# Patient Record
Sex: Female | Born: 1995 | Race: Black or African American | Hispanic: No | Marital: Single | Smoking: Never smoker
Health system: Southern US, Community
[De-identification: ages and names within clinical notes are randomized; demographics above are authoritative.]

## PROBLEM LIST (undated history)

## (undated) DIAGNOSIS — A64 Unspecified sexually transmitted disease: Secondary | ICD-10-CM

## (undated) DIAGNOSIS — N946 Dysmenorrhea, unspecified: Secondary | ICD-10-CM

## (undated) DIAGNOSIS — L309 Dermatitis, unspecified: Secondary | ICD-10-CM

## (undated) HISTORY — DX: Unspecified sexually transmitted disease: A64

## (undated) HISTORY — DX: Dysmenorrhea, unspecified: N94.6

---

## 2014-03-24 DIAGNOSIS — A64 Unspecified sexually transmitted disease: Secondary | ICD-10-CM

## 2014-03-24 HISTORY — DX: Unspecified sexually transmitted disease: A64

## 2014-04-09 ENCOUNTER — Encounter (HOSPITAL_COMMUNITY): Payer: Self-pay

## 2014-04-09 ENCOUNTER — Emergency Department (HOSPITAL_COMMUNITY)
Admission: EM | Admit: 2014-04-09 | Discharge: 2014-04-09 | Disposition: A | Discharge status: Home / Self Care | Payer: Medicaid Other | Attending: Emergency Medicine | Admitting: Emergency Medicine

## 2014-04-09 DIAGNOSIS — L500 Allergic urticaria: Secondary | ICD-10-CM | POA: Insufficient documentation

## 2014-04-09 DIAGNOSIS — R21 Rash and other nonspecific skin eruption: Secondary | ICD-10-CM | POA: Diagnosis present

## 2014-04-09 DIAGNOSIS — T7849XA Other allergy, initial encounter: Secondary | ICD-10-CM | POA: Insufficient documentation

## 2014-04-09 DIAGNOSIS — X58XXXA Exposure to other specified factors, initial encounter: Secondary | ICD-10-CM | POA: Insufficient documentation

## 2014-04-09 DIAGNOSIS — Y9389 Activity, other specified: Secondary | ICD-10-CM | POA: Insufficient documentation

## 2014-04-09 DIAGNOSIS — Y9289 Other specified places as the place of occurrence of the external cause: Secondary | ICD-10-CM | POA: Insufficient documentation

## 2014-04-09 DIAGNOSIS — Y998 Other external cause status: Secondary | ICD-10-CM | POA: Insufficient documentation

## 2014-04-09 DIAGNOSIS — T7840XA Allergy, unspecified, initial encounter: Secondary | ICD-10-CM

## 2014-04-09 MED ORDER — PREDNISONE 20 MG PO TABS
60.0000 mg | ORAL_TABLET | Freq: Once | ORAL | Status: AC
  Administered 2014-04-09: 60 mg via ORAL
  Filled 2014-04-09: qty 3

## 2014-04-09 MED ORDER — DIPHENHYDRAMINE HCL 25 MG PO CAPS
25.0000 mg | ORAL_CAPSULE | Freq: Once | ORAL | Status: AC
  Administered 2014-04-09: 25 mg via ORAL
  Filled 2014-04-09: qty 1

## 2014-04-09 MED ORDER — FAMOTIDINE 20 MG PO TABS
20.0000 mg | ORAL_TABLET | Freq: Once | ORAL | Status: AC
  Administered 2014-04-09: 20 mg via ORAL
  Filled 2014-04-09: qty 1

## 2014-04-09 MED ORDER — FAMOTIDINE 20 MG PO TABS: 20.0000 mg | ORAL_TABLET | Freq: Two times a day (BID) | ORAL | Status: DC

## 2014-04-09 MED ORDER — DIPHENHYDRAMINE HCL 25 MG PO CAPS: 25.0000 mg | ORAL_CAPSULE | ORAL | Status: DC | PRN

## 2014-04-09 NOTE — ED Provider Notes (Signed)
CSN: [id number]     Arrival date & time 04/09/14  1536 History  This chart was scribed for Fayrene Helper, PA-C, working with Gwyneth Sprout, MD by Chestine Spore, ED Scribe. The patient was seen in room TR08C/TR08C at 3:51 PM.    Chief Complaint  Patient presents with  . Rash    The history is provided by the patient. No language interpreter was used.    HPI Comments: Bethany Dodson is a 19 y.o. female who presents to the Emergency Department complaining of rash onset 1 week. She notes that she just changed her body wash 4 days ago. She reports that 3 days ago she noticed hives and itching on her forearms and back of both legs. She states that she is having associated symptoms of redness and itching. She states that she has not tried any medication for the relief of her symptoms. She denies fever, n/v/d, SOB, chest tightness, abdominal pain and any other symptoms. She denies any new medications or foods. She denies being allergic to any medications.   History reviewed. No pertinent past medical history. History reviewed. No pertinent past surgical history. No family history on file. History  Substance Use Topics  . Smoking status: Never Smoker   . Smokeless tobacco: Not on file  . Alcohol Use: No   OB History    No data available     Review of Systems  Constitutional: Negative for fever.  Respiratory: Negative for chest tightness and shortness of breath.   Gastrointestinal: Negative for nausea, vomiting, abdominal pain and diarrhea.  Skin: Positive for color change and rash.      Allergies  Review of patient's allergies indicates no known allergies.  Home Medications   Prior to Admission medications   Medication Sig Start Date End Date Taking? Authorizing Provider  diphenhydrAMINE (BENADRYL) 25 mg capsule Take 1 capsule (25 mg total) by mouth every 4 (four) hours as needed for allergies. 04/09/14   Fayrene Helper, PA-C  famotidine (PEPCID) 20 MG tablet Take 1 tablet (20 mg total)  by mouth 2 (two) times daily. 04/09/14   Fayrene Helper, PA-C   BP 113/69 mmHg  Pulse 74  Temp(Src) 98.3 F (36.8 C) (Oral)  Resp 18  Ht  (1.651 m)  Wt 120 lb (54.432 kg)  BMI 19.97 kg/m2  SpO2 100%  Physical Exam  Constitutional: She is oriented to person, place, and time. She appears well-developed and well-nourished. No distress.  HENT:  Head: Normocephalic and atraumatic.  Eyes: EOM are normal.  Neck: Neck supple. No tracheal deviation present.  Cardiovascular: Normal rate.   Pulmonary/Chest: Effort normal and breath sounds normal. No respiratory distress. She has no wheezes. She has no rales.  Musculoskeletal: Normal range of motion.  Neurological: She is alert and oriented to person, place, and time.  Skin: Skin is warm and dry. Rash noted. Rash is urticarial. No erythema.  Small area of Urticaria noted to bilateral forearms.  No erythema noted to the area.  Psychiatric: She has a normal mood and affect. Her behavior is normal.  Nursing note and vitals reviewed.   ED Course  Procedures (including critical care time) DIAGNOSTIC STUDIES: Oxygen Saturation is 100% on room air, normal by my interpretation.    COORDINATION OF CARE: 3:54 PM-Discussed treatment plan which includes benadryl Rx, deltasone, Pepcid Rx with pt at bedside and pt agreed to plan.   3:59 PM Patient with allergic reaction, likely due to either body wash or soap. She has no sxs  of anaphylatic reaction.  Stable for discharge.   Labs Review Labs Reviewed - No data to display  Imaging Review No results found.   EKG Interpretation None      MDM   Final diagnoses:  Allergic reaction, initial encounter    BP 113/69 mmHg  Pulse 74  Temp(Src) 98.3 F (36.8 C) (Oral)  Resp 18  Ht 5\' 5"  (1.651 m)  Wt 120 lb (54.432 kg)  BMI 19.97 kg/m2  SpO2 100%  LMP 04/09/2014   I personally performed the services described in this documentation, which was scribed in my presence. The recorded  information has been reviewed and is accurate.    Fayrene HelperBowie Kalev Temme, PA-C 04/09/14 1601  Gwyneth SproutWhitney Plunkett, MD 04/09/14 2240

## 2014-04-09 NOTE — ED Notes (Signed)
Declined W/C at D/C and was escorted to lobby by RN. 

## 2014-04-09 NOTE — Discharge Instructions (Signed)
Allergies °Allergies may happen from anything your body is sensitive to. This may be food, medicines, pollens, chemicals, and nearly anything around you in everyday life that produces allergens. An allergen is anything that causes an allergy producing substance. Heredity is often a factor in causing these problems. This means you may have some of the same allergies as your parents. °Food allergies happen in all age groups. Food allergies are some of the most severe and life threatening. Some common food allergies are cow's milk, seafood, eggs, nuts, wheat, and soybeans. °SYMPTOMS  °· Swelling around the mouth. °· An itchy red rash or hives. °· Vomiting or diarrhea. °· Difficulty breathing. °SEVERE ALLERGIC REACTIONS ARE LIFE-THREATENING. °This reaction is called anaphylaxis. It can cause the mouth and throat to swell and cause difficulty with breathing and swallowing. In severe reactions only a trace amount of food (for example, peanut oil in a salad) may cause death within seconds. °Seasonal allergies occur in all age groups. These are seasonal because they usually occur during the same season every year. They may be a reaction to molds, grass pollens, or tree pollens. Other causes of problems are house dust mite allergens, pet dander, and mold spores. The symptoms often consist of nasal congestion, a runny itchy nose associated with sneezing, and tearing itchy eyes. There is often an associated itching of the mouth and ears. The problems happen when you come in contact with pollens and other allergens. Allergens are the particles in the air that the body reacts to with an allergic reaction. This causes you to release allergic antibodies. Through a chain of events, these eventually cause you to release histamine into the blood stream. Although it is meant to be protective to the body, it is this release that causes your discomfort. This is why you were given anti-histamines to feel better.  If you are unable to  pinpoint the offending allergen, it may be determined by skin or blood testing. Allergies cannot be cured but can be controlled with medicine. °Hay fever is a collection of all or some of the seasonal allergy problems. It may often be treated with simple over-the-counter medicine such as diphenhydramine. Take medicine as directed. Do not drink alcohol or drive while taking this medicine. Check with your caregiver or package insert for child dosages. °If these medicines are not effective, there are many new medicines your caregiver can prescribe. Stronger medicine such as nasal spray, eye drops, and corticosteroids may be used if the first things you try do not work well. Other treatments such as immunotherapy or desensitizing injections can be used if all else fails. Follow up with your caregiver if problems continue. These seasonal allergies are usually not life threatening. They are generally more of a nuisance that can often be handled using medicine. °HOME CARE INSTRUCTIONS  °· If unsure what causes a reaction, keep a diary of foods eaten and symptoms that follow. Avoid foods that cause reactions. °· If hives or rash are present: °¨ Take medicine as directed. °¨ You may use an over-the-counter antihistamine (diphenhydramine) for hives and itching as needed. °¨ Apply cold compresses (cloths) to the skin or take baths in cool water. Avoid hot baths or showers. Heat will make a rash and itching worse. °· If you are severely allergic: °¨ Following a treatment for a severe reaction, hospitalization is often required for closer follow-up. °¨ Wear a medic-alert bracelet or necklace stating the allergy. °¨ You and your family must learn how to give adrenaline or use   an anaphylaxis kit.  If you have had a severe reaction, always carry your anaphylaxis kit or EpiPen with you. Use this medicine as directed by your caregiver if a severe reaction is occurring. Failure to do so could have a fatal outcome. SEEK MEDICAL  CARE IF:  You suspect a food allergy. Symptoms generally happen within 30 minutes of eating a food.  Your symptoms have not gone away within 2 days or are getting worse.  You develop new symptoms.  You want to retest yourself or your child with a food or drink you think causes an allergic reaction. Never do this if an anaphylactic reaction to that food or drink has happened before. Only do this under the care of a caregiver. SEEK IMMEDIATE MEDICAL CARE IF:   You have difficulty breathing, are wheezing, or have a tight feeling in your chest or throat.  You have a swollen mouth, or you have hives, swelling, or itching all over your body.  You have had a severe reaction that has responded to your anaphylaxis kit or an EpiPen. These reactions may return when the medicine has worn off. These reactions should be considered life threatening. MAKE SURE YOU:   Understand these instructions.  Will watch your condition.  Will get help right away if you are not doing well or get worse. Document Released: 06/03/2002 Document Revised: 07/05/2012 Document Reviewed: 11/08/2007 [facility] Patient Information 2015 [place], [place]. This information is not intended to replace advice given to you by your health care provider. Make sure you discuss any questions you have with your health care provider.   Emergency Department Resource Guide 1) Find a Doctor and Pay Out of Pocket Although you won't have to find out who is covered by your insurance plan, it is a good idea to ask around and get recommendations. You will then need to call the office and see if the doctor you have chosen will accept you as a new patient and what types of options they offer for patients who are self-pay. Some doctors offer discounts or will set up payment plans for their patients who do not have insurance, but you will need to ask so you aren't surprised when you get to your appointment.  2) Contact Your Local Health  Department Not all health departments have doctors that can see patients for sick visits, but many do, so it is worth a call to see if yours does. If you don't know where your local health department is, you can check in your phone book. The CDC also has a tool to help you locate your state's health department, and many state websites also have listings of all of their local health departments.  3) Find a [facility] If your illness is not likely to be very severe or complicated, you may want to try a walk in clinic. These are popping up all over the country in pharmacies, drugstores, and shopping centers. They're usually staffed by nurse practitioners or physician assistants that have been trained to treat common illnesses and complaints. They're usually fairly quick and inexpensive. However, if you have serious medical issues or chronic medical problems, these are probably not your best option.  No Primary Care Doctor: - Call [facility] Connect at  ([phone] - they can help you locate a primary care doctor that  accepts your insurance, provides certain services, etc. - Physician Referral Service- [phone]  Chronic Pain Problems: Organization         Address  Phone  Notes  °[facility]  (336) 297-2271 Patients need to be referred by their primary care doctor.  ° °Medication Assistance: °Organization         Address  Phone   Notes  °[place] [facility] [address] °[place], [place] [address] (336) 641-8030 --Must be a resident of [place] °-- Must have NO insurance coverage whatsoever (no Medicaid/ Medicare, etc.) °-- The pt. MUST have a primary care doctor that directs their care regularly and follows them in the community °  °[facility]  (866) 331-1348   °[employer]  (888) 892-1162   ° °Agencies that provide inexpensive medical care: °Organization         Address  Phone   Notes  °[facility]  (336) 832-8035   °[facility]    (336) 832-7272   °[facility] [address] °[place], [place] [address] (336) 832-4777   °[facility] [address], °[place] (336) 271-4999   °[facility]    (336) 373-0678   °[facility]    (336) 272-1050   °[facility] ° [address], [place] Phone:  (336) 832-4444, Fax:  (336) 832-4440 Hours of Operation:  9 am - 6 pm, M-F.  Also accepts Medicaid/Medicare and self-pay.  °[facility] ° [address], [place] Phone: (336) [phone], Fax: (336) [phone]. Hours of Operation:  8:30 am - 5:30 pm, M-F.  Also accepts Medicaid and self-pay.  °[facility] [place] [address], [place] Phone: (336) 878-6027   °[facility] [address], [place], [place] (336)723-1848, Ext. 123 Mondays & Thursdays: 7-9 AM.  First 15 patients are seen on a first come, first serve basis. °  ° °Medicaid-accepting [place] Providers: ° °Organization         Address  Phone   Notes  °[facility] [address], [place] (336) 641-2100 Also accepts self-pay patients.  °[facility] [address], [place] ° (336) 856-9996   °[facility] [address], [place] (336) 288-8857   °[facility] [address] [place] [address], [place] (336) 299-7000   °Veita Bland [address], [place]  ° (336) 373-1557 Only accepts [place] Access Medicaid patients after they have their name applied to their card.  ° °Self-Pay (no insurance) in [place]: ° °Organization         Address  Phone   Notes  °Sickle Cell Patients, [facility] [address], [place] (336) 832-1970   °[facility] [address], [place] (336) 832-4400   °[facility] [place] ° [address],  [place] (336) 992-4800   °[facility]/Dr. Osei-Bonsu ° [address] [place] [address], [place] or [address], [place] (336) [phone] Phone number for both [place] and [place] locations is the same.  °[facility] [address], [place] (336) 299-0000   °[facility] [address] [place] [address], [place] or [address] (336) 852-7530 °(336) 878-2260   °[facility] [address], [place] (336) 350-1642, phone; (336) 294-5005, fax Sees patients 1st and 3rd Saturday of every month.  Must not qualify for public or private insurance (  i.e. Medicaid, Medicare, [place] Health Choice, Veterans' Benefits) • Household income should be no more than 200% of the poverty level •The clinic cannot treat you if you are pregnant or think you are pregnant • Sexually transmitted diseases are not treated at the clinic.  ° ° °Dental Care: °Organization         Address  Phone  Notes  °[facility] [address], [place] (336) 641-6152 Accepts children up to age 21 who are enrolled in Medicaid or [place] Health Choice; pregnant women with a Medicaid card; and children who have applied for Medicaid or [place] Health Choice, but were declined, whose parents can pay a reduced fee at time of service.  °[facility] [place]  [address], [place] (336) 641-7733 Accepts children up to age 21 who are enrolled in Medicaid or [place] Health Choice; pregnant women with a Medicaid card; and children who have applied for Medicaid or [place] Health Choice, but were declined, whose parents can pay a reduced fee at time of service.  °[facility] ° [address], [place] (336) 641-4533 Patients are seen by appointment only. Walk-ins are not accepted. [facility] will see patients 18 years of age and older. °Monday - Tuesday (8am-5pm) °Most Wednesdays  (8:30-5pm) °$30 per visit, cash only  °[facility] ° [address], [place] (336) 641-4533 Patients are seen by appointment only. Walk-ins are not accepted. [facility] will see patients 18 years of age and older. °One Wednesday Evening (Monthly: Volunteer Based).  $30 per visit, cash only  °[facility]  (919) 537-3737 for adults; Children under age 4, call Graduate Pediatric Dentistry at ([phone]) [phone]. Children aged 4-14, please call ([phone]) [phone] to request a pediatric application. ° Dental services are provided in all areas of dental care including fillings, crowns and bridges, complete and partial dentures, implants, gum treatment, root canals, and extractions. Preventive care is also provided. Treatment is provided to both adults and children. °Patients are selected via a lottery and there is often a waiting list. °  °[facility] [address], °[place] ° (336) 763-8833 [email or web address] °  °[facility] [address], [place], [place] (336)723-1848, Ext. 123 Second and Fourth Thursday of each month, opens at 6:30 AM; Clinic ends at 9 AM.  Patients are seen on a first-come first-served basis, and a limited number are seen during each clinic.  ° °[facility] ° [address], [place], [place] (336) 723-7904   Eligibility Requirements °You must have lived in [place], [place], or [place] counties for at least the last three months. °  You cannot be eligible for state or federal sponsored healthcare insurance, including Veterans Administration, Medicaid, or Medicare. °  You generally cannot be eligible for healthcare insurance through your employer.  °  How to apply: °Eligibility screenings are held every Tuesday and Wednesday afternoon from 1:00 pm until 4:00 pm. You do not need an appointment for the interview!  °[facility] [address], [place], [place] 336-631-2330   °[place]  [facility]  336-342-8273   °[place]  336-703-3100   °[facility]  336-570-6415   ° °Behavioral Health Resources in the Community: °Intensive Outpatient Programs °Organization         Address  Phone  Notes  °[place]   [facility] [address], [place], [place] 336-878-6098   °[facility] [address], [place], [place] 336-832-9800   °[facility] [address], [place], [place] ° 336-882-2125   °[facility] [address],  °[place], [place] 1-800-853-5163 or 336-641-4981   °Substance Abuse Resources °Organization         Address  Phone  Notes  °[facility]  336-882-2125   °[facility]  336-784-9470   °[facility] [place] [facility]  336-285-9073   °[facility]  336-845-3988   °Residential & Outpatient Substance Abuse Program  1-800-659-3381   °Psychological Services °Organization         Address  Phone  Notes  °[facility]  336- 832-9600   °[facility]  336- 378-7881   °[facility] [address], [place] 1-800-853-5163 or 336-641-4981   ° °Mobile Crisis Teams °Organization         Address  Phone  Notes  °[facility], Mobile Crisis Care Unit  1-877-626-1772   °[facility] °[facility] ° [address] [place], [place] 336-834-9664   °Sharon DeEsch [address] °[place] 336-554-5454   ° °Self-Help/Support Groups °Organization         Address  Phone             Notes  °[facility] - variety of support groups  336- 373-1402 Call for more information  °Narcotics Anonymous (NA), [facility] [address], °[place]  2 meetings at this location  ° °Residential Treatment Programs °Organization         Address  Phone  Notes  °[facility] [address],    °[place]  1-866-801-8205   °[facility] ° [address], [place], [place]  704-293-8524   °[facility] [address], [place] 336-845-3988 Admissions: 8am-3pm M-F  °[facility] [address]    °[place], [place] 336-841-1104   °[facility] [address], [place], [place] 336-379-7146   °[facility] [address]  °[place], [place] 336-285-9073   °[facility] - Intensive Outpatient [address], [place], [place] 336-852-3033   °[facility] [address]  °[place], [place] 1-877-615-2722 or 336-784-9470   °[facility] [address] [place], [place] 336-227-7417 Accepts Medicaid  °[facility] [address]  ° [place] 1-800-659-3381 Substance Abuse/Addiction Treatment  ° °[place] Behavioral Health Resources °Organization         Address  Phone  Notes  °[facility]  (888) 581-9988   °Julie Brannon, PhD [address] [place], [place]   (336) 349-5553 or (336) 951-0000   °[facility]   [address] °[place], [place] (336) 349-4454   °[facility] [address], [place], [place] (336) 342-8316 Insurance/Medicaid/sponsorship through [facility]  °[facility] [address]                                    [place], [place] (336) 342-8316 Therapy/tele-psych/case  °[facility] [address]  ° , [place] (336) 349-2233    °Dr. Arfeen  (336) 349-4544   °[facility] [place]  [employer] [place] [facility] 1)   [address], [place] °2) [address], [place] °3)  [address], [place] (336) 349-3220 °(336) 342-7768 ° °(336) 342-8140   °[place] [facility] Abuse [facility] (336) 342-1394 or (336) 342-3537 (After Hours)    ° ° ° °

## 2014-04-09 NOTE — ED Notes (Signed)
Pt. Reports generalized rash x1 week with redness and itching. States it is worse at night. Denies fevers, N/v.

## 2014-05-24 ENCOUNTER — Encounter (HOSPITAL_COMMUNITY): Payer: Self-pay

## 2014-05-24 ENCOUNTER — Emergency Department (HOSPITAL_COMMUNITY)
Admission: EM | Admit: 2014-05-24 | Discharge: 2014-05-24 | Disposition: A | Discharge status: Home / Self Care | Payer: Medicaid Other | Attending: Emergency Medicine | Admitting: Emergency Medicine

## 2014-05-24 DIAGNOSIS — Z79899 Other long term (current) drug therapy: Secondary | ICD-10-CM | POA: Insufficient documentation

## 2014-05-24 DIAGNOSIS — J029 Acute pharyngitis, unspecified: Secondary | ICD-10-CM | POA: Insufficient documentation

## 2014-05-24 LAB — RAPID STREP SCREEN (MED CTR MEBANE ONLY): Streptococcus, Group A Screen (Direct): NEGATIVE

## 2014-05-24 MED ORDER — SALINE SPRAY 0.65 % NA SOLN: 1.0000 | NASAL | Status: DC | PRN

## 2014-05-24 MED ORDER — GUAIFENESIN 100 MG/5ML PO LIQD: 100.0000 mg | ORAL | Status: DC | PRN

## 2014-05-24 MED ORDER — IBUPROFEN 600 MG PO TABS: 600.0000 mg | ORAL_TABLET | Freq: Four times a day (QID) | ORAL | Status: DC | PRN

## 2014-05-24 NOTE — ED Notes (Signed)
Pt. Given Orange juice

## 2014-05-24 NOTE — ED Notes (Signed)
Pt. Reports dry cough and sore throat.

## 2014-05-24 NOTE — ED Provider Notes (Signed)
CSN: [id number]     Arrival date & time 05/24/14  1716 History  This chart was scribed for Bethany Finner, PA-C, working with Harrold Donath R. Rubin Payor, MD by Chestine Spore, ED Scribe. The patient was seen in room TR07C/TR07C at 6:57 PM.    Chief Complaint  Patient presents with  . Sore Throat      The history is provided by the patient. No language interpreter was used.    HPI Comments: Bethany Dodson is a 19 y.o. female who presents to the Emergency Department complaining of sore throat onset yesterday. Pain is sore, 5/10, worse with eating. Pt reports that her sister is sick as well. She states that she is having associated symptoms of dry cough, fever, chills, congestion, and HA. She states that she has tried mucinex with no relief for her symptoms. She denies ear pain, abdominal pain, nausea, vomiting, trouble swallowing, SOB, and any other symptoms. Denies being allergic to any medications.   History reviewed. No pertinent past medical history. History reviewed. No pertinent past surgical history. No family history on file. History  Substance Use Topics  . Smoking status: Never Smoker   . Smokeless tobacco: Not on file  . Alcohol Use: No   OB History    No data available     Review of Systems  Constitutional: Positive for fever and chills.  HENT: Positive for congestion and sore throat. Negative for ear pain and trouble swallowing.   Respiratory: Positive for cough. Negative for shortness of breath.   Gastrointestinal: Negative for nausea, vomiting and abdominal pain.  Neurological: Positive for headaches.      Allergies  Review of patient's allergies indicates no known allergies.  Home Medications   Prior to Admission medications   Medication Sig Start Date End Date Taking? Authorizing Provider  diphenhydrAMINE (BENADRYL) 25 mg capsule Take 1 capsule (25 mg total) by mouth every 4 (four) hours as needed for allergies. 04/09/14   Fayrene Helper, PA-C  famotidine (PEPCID) 20 MG  tablet Take 1 tablet (20 mg total) by mouth 2 (two) times daily. 04/09/14   Fayrene Helper, PA-C  guaiFENesin (ROBITUSSIN) 100 MG/5ML liquid Take 5-10 mLs (100-200 mg total) by mouth every 4 (four) hours as needed for cough. 05/24/14   Bethany Finner, PA-C  ibuprofen (ADVIL,MOTRIN) 600 MG tablet Take 1 tablet (600 mg total) by mouth every 6 (six) hours as needed. 05/24/14   Bethany Finner, PA-C  sodium chloride (OCEAN) 0.65 % SOLN nasal spray Place 1 spray into both nostrils as needed for congestion. 05/24/14   Bethany Finner, PA-C   BP 140/83 mmHg  Pulse 104  Temp(Src) 98.7 F (37.1 C) (Oral)  Resp 16  Ht  (1.651 m)  Wt 122 lb (55.339 kg)  BMI 20.30 kg/m2  SpO2 100%  LMP 05/24/2014  Physical Exam  Constitutional: She is oriented to person, place, and time. She appears well-developed and well-nourished.  HENT:  Head: Normocephalic and atraumatic.  Right Ear: Tympanic membrane normal.  Left Ear: Tympanic membrane normal.  Mouth/Throat: Uvula is midline. Posterior oropharyngeal edema and posterior oropharyngeal erythema present. No oropharyngeal exudate or tonsillar abscesses.  Eyes: EOM are normal.  Neck: Normal range of motion.  Cardiovascular: Normal rate, regular rhythm and normal heart sounds.  Exam reveals no gallop and no friction rub.   No murmur heard. Pulmonary/Chest: Effort normal and breath sounds normal. No respiratory distress. She has no wheezes. She has no rales.  Abdominal: Soft. There is no tenderness.  Musculoskeletal: Normal  range of motion.  Neurological: She is alert and oriented to person, place, and time.  Skin: Skin is warm and dry.  Psychiatric: She has a normal mood and affect. Her behavior is normal.  Nursing note and vitals reviewed.   ED Course  Procedures (including critical care time) DIAGNOSTIC STUDIES: Oxygen Saturation is 100% on room air, normal by my interpretation.    COORDINATION OF CARE: 7:00 PM-Discussed treatment plan which includes rapid  strep, f/u if the symptoms worsen with pt at bedside and pt agreed to plan.   Labs Review Labs Reviewed  RAPID STREP SCREEN  CULTURE, GROUP A STREP    Imaging Review No results found.   EKG Interpretation None      MDM   Final diagnoses:  Viral pharyngitis   Pt c/o sore throat. No tonsillar abscess. Rapid strep: negative. Will tx symptomatically for viral pharyngitis. Home care instructions provided. Return precautions provided. Pt verbalized understanding and agreement with tx plan.   I personally performed the services described in this documentation, which was scribed in my presence. The recorded information has been reviewed and is accurate.   Bethany Finnerrin O'Malley, PA-C 05/25/14 0121  Juliet RudeNathan R. Rubin PayorPickering, MD 05/25/14 [phone]

## 2014-05-28 LAB — CULTURE, GROUP A STREP

## 2014-07-17 ENCOUNTER — Emergency Department (INDEPENDENT_AMBULATORY_CARE_PROVIDER_SITE_OTHER)
Admission: EM | Admit: 2014-07-17 | Discharge: 2014-07-17 | Disposition: A | Discharge status: Home / Self Care | Payer: Medicaid Other | Source: Home / Self Care | Attending: Family Medicine | Admitting: Family Medicine

## 2014-07-17 ENCOUNTER — Encounter (HOSPITAL_COMMUNITY): Payer: Self-pay | Admitting: Emergency Medicine

## 2014-07-17 DIAGNOSIS — N3001 Acute cystitis with hematuria: Secondary | ICD-10-CM | POA: Diagnosis not present

## 2014-07-17 DIAGNOSIS — L209 Atopic dermatitis, unspecified: Secondary | ICD-10-CM | POA: Diagnosis present

## 2014-07-17 HISTORY — DX: Dermatitis, unspecified: L30.9

## 2014-07-17 LAB — POCT PREGNANCY, URINE: PREG TEST UR: NEGATIVE

## 2014-07-17 LAB — POCT URINALYSIS DIP (DEVICE)
Bilirubin Urine: NEGATIVE
Glucose, UA: NEGATIVE mg/dL
Ketones, ur: NEGATIVE mg/dL
Nitrite: NEGATIVE
Protein, ur: 30 mg/dL — AB
Specific Gravity, Urine: 1.025 (ref 1.005–1.030)
UROBILINOGEN UA: 0.2 mg/dL (ref 0.0–1.0)
pH: 6.5 (ref 5.0–8.0)

## 2014-07-17 MED ORDER — CEPHALEXIN 500 MG PO CAPS: 500.0000 mg | ORAL_CAPSULE | Freq: Two times a day (BID) | ORAL | Status: DC

## 2014-07-17 MED ORDER — TRIAMCINOLONE ACETONIDE 0.5 % EX OINT: 1.0000 "application " | TOPICAL_OINTMENT | Freq: Two times a day (BID) | CUTANEOUS | Status: DC

## 2014-07-17 NOTE — ED Notes (Signed)
Pt has had an odor to her urine for about a week.  She denies any other concerning symptoms.  Pt is also out of her cream for her Eczema.

## 2014-07-17 NOTE — Discharge Instructions (Signed)
Thank you for coming in today. Follow-up with primary care provider. Urinary Tract Infection Urinary tract infections (UTIs) can develop anywhere along your urinary tract. Your urinary tract is your body's drainage system for removing wastes and extra water. Your urinary tract includes two kidneys, two ureters, a bladder, and a urethra. Your kidneys are a pair of bean-shaped organs. Each kidney is about the size of your fist. They are located below your ribs, one on each side of your spine. CAUSES Infections are caused by microbes, which are microscopic organisms, including fungi, viruses, and bacteria. These organisms are so small that they can only be seen through a microscope. Bacteria are the microbes that most commonly cause UTIs. SYMPTOMS  Symptoms of UTIs may vary by age and gender of the patient and by the location of the infection. Symptoms in young women typically include a frequent and intense urge to urinate and a painful, burning feeling in the bladder or urethra during urination. Older women and men are more likely to be tired, shaky, and weak and have muscle aches and abdominal pain. A fever may mean the infection is in your kidneys. Other symptoms of a kidney infection include pain in your back or sides below the ribs, nausea, and vomiting. DIAGNOSIS To diagnose a UTI, your caregiver will ask you about your symptoms. Your caregiver also will ask to provide a urine sample. The urine sample will be tested for bacteria and white blood cells. White blood cells are made by your body to help fight infection. TREATMENT  Typically, UTIs can be treated with medication. Because most UTIs are caused by a bacterial infection, they usually can be treated with the use of antibiotics. The choice of antibiotic and length of treatment depend on your symptoms and the type of bacteria causing your infection. HOME CARE INSTRUCTIONS  If you were prescribed antibiotics, take them exactly as your caregiver  instructs you. Finish the medication even if you feel better after you have only taken some of the medication.  Drink enough water and fluids to keep your urine clear or pale yellow.  Avoid caffeine, tea, and carbonated beverages. They tend to irritate your bladder.  Empty your bladder often. Avoid holding urine for long periods of time.  Empty your bladder before and after sexual intercourse.  After a bowel movement, women should cleanse from front to back. Use each tissue only once. SEEK MEDICAL CARE IF:   You have back pain.  You develop a fever.  Your symptoms do not begin to resolve within 3 days. SEEK IMMEDIATE MEDICAL CARE IF:     You have severe back pain or lower abdominal pain.  You develop chills.  You have nausea or vomiting.  You have continued burning or discomfort with urination. MAKE SURE YOU:   Understand these instructions.  Will watch your condition.  Will get help right away if you are not doing well or get worse. Document Released: 12/18/2004 Document Revised: 09/09/2011 Document Reviewed: 04/18/2011 [facility] Patient Information 2015 [employer], [place]. This information is not intended to replace advice given to you by your health care provider. Make sure you discuss any questions you have with your health care provider.   Eczema Eczema, also called atopic dermatitis, is a skin disorder that causes inflammation of the skin. It causes a red rash and dry, scaly skin. The skin becomes very itchy. Eczema is generally worse during the cooler winter months and often improves with the warmth of summer. Eczema usually starts showing signs  in infancy. Some children outgrow eczema, but it may last through adulthood.  CAUSES  The exact cause of eczema is not known, but it appears to run in families. People with eczema often have a family history of eczema, allergies, asthma, or hay fever. Eczema is not contagious. Flare-ups of the condition may be caused by:    Contact with something you are sensitive or allergic to.   Stress. SIGNS AND SYMPTOMS  Dry, scaly skin.   Red, itchy rash.   Itchiness. This may occur before the skin rash and may be very intense.  DIAGNOSIS  The diagnosis of eczema is usually made based on symptoms and medical history. TREATMENT  Eczema cannot be cured, but symptoms usually can be controlled with treatment and other strategies. A treatment plan might include:  Controlling the itching and scratching.   Use over-the-counter antihistamines as directed for itching. This is especially useful at night when the itching tends to be worse.   Use over-the-counter steroid creams as directed for itching.   Avoid scratching. Scratching makes the rash and itching worse. It may also result in a skin infection (impetigo) due to a break in the skin caused by scratching.   Keeping the skin well moisturized with creams every day. This will seal in moisture and help prevent dryness. Lotions that contain alcohol and water should be avoided because they can dry the skin.   Limiting exposure to things that you are sensitive or allergic to (allergens).   Recognizing situations that cause stress.   Developing a plan to manage stress.  HOME CARE INSTRUCTIONS   Only take over-the-counter or prescription medicines as directed by your health care provider.   Do not use anything on the skin without checking with your health care provider.   Keep baths or showers short (5 minutes) in warm (not hot) water. Use mild cleansers for bathing. These should be unscented. You may add nonperfumed bath oil to the bath water. It is best to avoid soap and bubble bath.   Immediately after a bath or shower, when the skin is still damp, apply a moisturizing ointment to the entire body. This ointment should be a petroleum ointment. This will seal in moisture and help prevent dryness. The thicker the ointment, the better. These should be  unscented.   Keep fingernails cut short. Children with eczema may need to wear soft gloves or mittens at night after applying an ointment.   Dress in clothes made of cotton or cotton blends. Dress lightly, because heat increases itching.   A child with eczema should stay away from anyone with fever blisters or cold sores. The virus that causes fever blisters (herpes simplex) can cause a serious skin infection in children with eczema. SEEK MEDICAL CARE IF:   Your itching interferes with sleep.   Your rash gets worse or is not better within 1 week after starting treatment.   You see pus or soft yellow scabs in the rash area.   You have a fever.   You have a rash flare-up after contact with someone who has fever blisters.  Document Released: 03/07/2000 Document Revised: 12/29/2012 Document Reviewed: 10/11/2012 [facility] Patient Information 2015 [place], [place]. This information is not intended to replace advice given to you by your health care provider. Make sure you discuss any questions you have with your health care provider.

## 2014-07-17 NOTE — ED Provider Notes (Signed)
Bethany Dodson is a 19 y.o. female who presents to Urgent Care today for urinary odor. Over the past week patient has noted a foul smell in her urine. She denies any frequency urgency or dysuria. She denies any vaginal discharge or itching. She's not tried any medications yet. Additionally she notes chronic eczema. She has run out of her cream that she was using for eczema and would like a refill. She is not sure of the name of the medicine she was taking. She notes thickened hyperpigmented skin in her flexor creases. They are somewhat itchy.   Past Medical History  Diagnosis Date  . Eczema    History reviewed. No pertinent past surgical history. History  Substance Use Topics  . Smoking status: Never Smoker   . Smokeless tobacco: Not on file  . Alcohol Use: No   ROS as above Medications: No current facility-administered medications for this encounter.   Current Outpatient Prescriptions  Medication Sig Dispense Refill  . cephALEXin (KEFLEX) 500 MG capsule Take 1 capsule (500 mg total) by mouth 2 (two) times daily. 14 capsule 0  . diphenhydrAMINE (BENADRYL) 25 mg capsule Take 1 capsule (25 mg total) by mouth every 4 (four) hours as needed for allergies. 30 capsule 0  . famotidine (PEPCID) 20 MG tablet Take 1 tablet (20 mg total) by mouth 2 (two) times daily. 20 tablet 0  . guaiFENesin (ROBITUSSIN) 100 MG/5ML liquid Take 5-10 mLs (100-200 mg total) by mouth every 4 (four) hours as needed for cough. 60 mL 0  . ibuprofen (ADVIL,MOTRIN) 600 MG tablet Take 1 tablet (600 mg total) by mouth every 6 (six) hours as needed. 30 tablet 0  . sodium chloride (OCEAN) 0.65 % SOLN nasal spray Place 1 spray into both nostrils as needed for congestion. 1 Bottle 0  . triamcinolone ointment (KENALOG) 0.5 % Apply 1 application topically 2 (two) times daily. 60 g 1   No Known Allergies   Exam:  BP 116/69 mmHg  Pulse 59  Temp(Src) 98.6 F (37 C) (Oral)  Resp 16  SpO2 100%  LMP  Gen: Well NAD HEENT:  EOMI,  MMM Lungs: Normal work of breathing. CTABL Heart: RRR no MRG Abd: NABS, Soft. Nondistended, Nontender Exts: Brisk capillary refill, warm and well perfused. No CVA angle tenderness to percussion Skin: Thickened hyperpigmented skin flexor creases elbows bilaterally.  Results for orders placed or performed during the hospital encounter of 07/17/14 (from the past 24 hour(s))  POCT urinalysis dip (device)     Status: Abnormal   Collection Time: 07/17/14  2:00 PM  Result Value Ref Range   Glucose, UA NEGATIVE NEGATIVE mg/dL   Bilirubin Urine NEGATIVE NEGATIVE   Ketones, ur NEGATIVE NEGATIVE mg/dL   Specific Gravity, Urine 1.025 1.005 - 1.030   Hgb urine dipstick LARGE (A) NEGATIVE   pH 6.5 5.0 - 8.0   Protein, ur 30 (A) NEGATIVE mg/dL   Urobilinogen, UA 0.2 0.0 - 1.0 mg/dL   Nitrite NEGATIVE NEGATIVE   Leukocytes, UA MODERATE (A) NEGATIVE  Pregnancy, urine POC     Status: None   Collection Time: 07/17/14  2:04 PM  Result Value Ref Range   Preg Test, Ur NEGATIVE NEGATIVE   No results found.  Assessment and Plan: [age] y.o. female with  1) UTI: Culture pending treat with Keflex 2) chronic atopic dermatitis: Triamcinolone ointment follow-up with PCP  Discussed warning signs or symptoms. Please see discharge instructions. Patient expresses understanding.     Rodolph BongEvan S Zackary Mckeone, MD 07/17/14  1408 

## 2014-07-20 LAB — URINE CULTURE: Special Requests: NORMAL

## 2014-07-21 NOTE — ED Notes (Signed)
Urine culture: 80,000 colonies E. Coli.  Pt. adequately treated with Keflex. Vassie MoselleYork, Docia Klar M  07/21/2014

## 2014-07-22 ENCOUNTER — Telehealth: Payer: Self-pay | Admitting: Emergency Medicine

## 2014-07-22 NOTE — Telephone Encounter (Signed)
Post ED Visit - Positive Culture Follow-up  Culture report reviewed by antimicrobial stewardship pharmacist: []  Wes Dulaney, Pharm.D., BCPS []  Celedonio MiyamotoJeremy Frens, Pharm.D., BCPS []  Georgina PillionElizabeth Martin, Pharm.D., BCPS [x]  AbiquiuMinh Pham, [address]Ph[address]D., BCPS, AAHIVP []  Estella HuskMichelle Turner, Pharm.D., BCPS, AAHIVP []  Elder CyphersLorie Poole, [address]Pharm[address]D., BCPS  Positive urineculture Treated with Cephalexin, organism sensitive to the same and no further patient follow-up is required at this time.  Jiles HaroldGammons, Abigayl Hor Chaney 07/22/2014, 10:25 AM

## 2015-04-04 ENCOUNTER — Emergency Department (HOSPITAL_COMMUNITY)
Admission: EM | Admit: 2015-04-04 | Discharge: 2015-04-04 | Disposition: A | Discharge status: Home / Self Care | Payer: Medicaid Other | Attending: Emergency Medicine | Admitting: Emergency Medicine

## 2015-04-04 ENCOUNTER — Encounter (HOSPITAL_COMMUNITY): Payer: Self-pay | Admitting: Emergency Medicine

## 2015-04-04 DIAGNOSIS — R0981 Nasal congestion: Secondary | ICD-10-CM | POA: Insufficient documentation

## 2015-04-04 DIAGNOSIS — R51 Headache: Secondary | ICD-10-CM | POA: Insufficient documentation

## 2015-04-04 DIAGNOSIS — N739 Female pelvic inflammatory disease, unspecified: Secondary | ICD-10-CM | POA: Insufficient documentation

## 2015-04-04 DIAGNOSIS — R590 Localized enlarged lymph nodes: Secondary | ICD-10-CM | POA: Insufficient documentation

## 2015-04-04 DIAGNOSIS — Z3202 Encounter for pregnancy test, result negative: Secondary | ICD-10-CM | POA: Insufficient documentation

## 2015-04-04 DIAGNOSIS — Z872 Personal history of diseases of the skin and subcutaneous tissue: Secondary | ICD-10-CM | POA: Insufficient documentation

## 2015-04-04 DIAGNOSIS — N730 Acute parametritis and pelvic cellulitis: Secondary | ICD-10-CM

## 2015-04-04 LAB — URINALYSIS, ROUTINE W REFLEX MICROSCOPIC
Bilirubin Urine: NEGATIVE
GLUCOSE, UA: NEGATIVE mg/dL
Hgb urine dipstick: NEGATIVE
Ketones, ur: 15 mg/dL — AB
Nitrite: POSITIVE — AB
PH: 6.5 (ref 5.0–8.0)
Protein, ur: NEGATIVE mg/dL
Specific Gravity, Urine: 1.025 (ref 1.005–1.030)

## 2015-04-04 LAB — WET PREP, GENITAL
Sperm: NONE SEEN
Trich, Wet Prep: NONE SEEN
Yeast Wet Prep HPF POC: NONE SEEN

## 2015-04-04 LAB — URINE MICROSCOPIC-ADD ON

## 2015-04-04 LAB — POC URINE PREG, ED: Preg Test, Ur: NEGATIVE

## 2015-04-04 MED ORDER — METOCLOPRAMIDE HCL 10 MG PO TABS
5.0000 mg | ORAL_TABLET | Freq: Once | ORAL | Status: AC
  Administered 2015-04-04: 5 mg via ORAL
  Filled 2015-04-04: qty 1

## 2015-04-04 MED ORDER — ACETAMINOPHEN 500 MG PO TABS
500.0000 mg | ORAL_TABLET | Freq: Once | ORAL | Status: AC
  Administered 2015-04-04: 650 mg via ORAL

## 2015-04-04 MED ORDER — KETOROLAC TROMETHAMINE 30 MG/ML IJ SOLN
30.0000 mg | Freq: Once | INTRAMUSCULAR | Status: AC
  Administered 2015-04-04: 30 mg via INTRAMUSCULAR
  Filled 2015-04-04: qty 1

## 2015-04-04 MED ORDER — ACETAMINOPHEN 325 MG PO TABS
ORAL_TABLET | ORAL | Status: AC
  Filled 2015-04-04: qty 2

## 2015-04-04 MED ORDER — DIPHENHYDRAMINE HCL 25 MG PO CAPS
25.0000 mg | ORAL_CAPSULE | Freq: Once | ORAL | Status: AC
  Administered 2015-04-04: 25 mg via ORAL
  Filled 2015-04-04: qty 1

## 2015-04-04 MED ORDER — SODIUM CHLORIDE 0.9 % IV BOLUS (SEPSIS)
1000.0000 mL | Freq: Once | INTRAVENOUS | Status: AC
  Administered 2015-04-04: 1000 mL via INTRAVENOUS

## 2015-04-04 MED ORDER — DOXYCYCLINE HYCLATE 100 MG PO CAPS: 100.0000 mg | ORAL_CAPSULE | Freq: Two times a day (BID) | ORAL | Status: DC

## 2015-04-04 MED ORDER — DEXTROSE 5 % IV SOLN
1.0000 g | Freq: Once | INTRAVENOUS | Status: AC
  Administered 2015-04-04: 1 g via INTRAVENOUS
  Filled 2015-04-04: qty 10

## 2015-04-04 NOTE — ED Notes (Signed)
Pt states she received a depo shot on the 9th and today got a headache and states she thinks its from the shot. Pt also states she has swollen lymph nodes in her pelvic area. Pt denies and abd pain or n/v. No vaginal discharge or urinary symptoms.

## 2015-04-04 NOTE — Discharge Instructions (Signed)

## 2015-04-04 NOTE — ED Provider Notes (Signed)
CSN: [id number]     Arrival date & time 04/04/15  1954 History  By signing my name below, I, Ronney Lion, attest that this documentation has been prepared under the direction and in the presence of [employer], PA-C. Electronically Signed: Ronney Lion, ED Scribe. 04/04/2015. 11:02 PM.    Chief Complaint  Patient presents with  . Headache  . Pelvic Pain   The history is provided by the patient. No language interpreter was used.    HPI Comments:  Bethany Dodson is a 20 y.o. female with no pertinent PMHx, who presents to the Emergency Department complaining of a gradual-onset, gradually worsening, anterior, aching headache that began this morning. She denies any trauma or injury. Patient also complains of a fever and slight nasal congestion. Patient also notes swollen inguinal lymph nodes and states she felt generally weak today, so she took a nap, with no relief to her symptoms. Looking in certain directions exacerbates her headache. She was given Tylenol on arrival to the ED here with some relief to her fever. Patient states she had her first Depo injection 2 days ago, on 04/02/15 and suspects this may have contributed to her symptoms. Patient states she is sexually active, although she recently had an STD test that was negative. She denies photophobia, nausea, vomiting, neck pain, neck stiffness, rhinorrhea, dysuria, malodorous or dark urine, vaginal discharge, or abdominal pain.   Past Medical History  Diagnosis Date  . Eczema    History reviewed. No pertinent past surgical history. No family history on file. Social History  Substance Use Topics  . Smoking status: Never Smoker   . Smokeless tobacco: None  . Alcohol Use: No   OB History    No data available     Review of Systems A complete 10 system review of systems was obtained and all systems are negative except as noted in the HPI and PMH.      Allergies  Review of patient's allergies indicates no known allergies.  Home  Medications   Prior to Admission medications   Medication Sig Start Date End Date Taking? Authorizing Provider  cephALEXin (KEFLEX) 500 MG capsule Take 1 capsule (500 mg total) by mouth 2 (two) times daily. Patient not taking: Reported on 04/04/2015 07/17/14   Rodolph Bong, MD  diphenhydrAMINE (BENADRYL) 25 mg capsule Take 1 capsule (25 mg total) by mouth every 4 (four) hours as needed for allergies. Patient not taking: Reported on 04/04/2015 04/09/14   Fayrene Helper, PA-C  doxycycline (VIBRAMYCIN) 100 MG capsule Take 1 capsule (100 mg total) by mouth 2 (two) times daily. 04/04/15   Eyvonne Mechanic, PA-C  famotidine (PEPCID) 20 MG tablet Take 1 tablet (20 mg total) by mouth 2 (two) times daily. Patient not taking: Reported on 04/04/2015 04/09/14   Fayrene Helper, PA-C  guaiFENesin (ROBITUSSIN) 100 MG/5ML liquid Take 5-10 mLs (100-200 mg total) by mouth every 4 (four) hours as needed for cough. Patient not taking: Reported on 04/04/2015 05/24/14   Junius Finner, PA-C  ibuprofen (ADVIL,MOTRIN) 600 MG tablet Take 1 tablet (600 mg total) by mouth every 6 (six) hours as needed. Patient not taking: Reported on 04/04/2015 05/24/14   Junius Finner, PA-C  sodium chloride (OCEAN) 0.65 % SOLN nasal spray Place 1 spray into both nostrils as needed for congestion. Patient not taking: Reported on 04/04/2015 05/24/14   Junius Finner, PA-C  triamcinolone ointment (KENALOG) 0.5 % Apply 1 application topically 2 (two) times daily. Patient not taking: Reported on 04/04/2015 07/17/14  Rodolph BongEvan S Corey, MD   BP 99/46 mmHg  Pulse 76  Temp(Src) 99.3 F (37.4 C) (Oral)  Resp 16  Ht 5\' 5"  (1.651 m)  Wt 54.432 kg  BMI 19.97 kg/m2  SpO2 100%  LMP 03/13/2015   Physical Exam  Constitutional: She is oriented to person, place, and time. She appears well-developed and well-nourished. No distress.  HENT:  Head: Normocephalic and atraumatic.  Eyes: Conjunctivae and EOM are normal. Pupils are equal, round, and reactive to light. Right eye  exhibits no discharge. Left eye exhibits no discharge. No scleral icterus.  Neck: Normal range of motion. Neck supple. No JVD present. No tracheal deviation present.  Cardiovascular: Normal rate.   Pulmonary/Chest: Effort normal. No stridor. No respiratory distress.  Abdominal: Soft. She exhibits no distension and no mass. There is no tenderness. There is no rebound and no guarding.  Bilateral tender inguinal lymphadenopathy   Genitourinary: Uterus normal. There is rash on the right labia. There is rash on the left labia. Cervix exhibits motion tenderness. Cervix exhibits no discharge and no friability. Right adnexum displays no mass, no tenderness and no fullness. Left adnexum displays no mass, no tenderness and no fullness. There is tenderness in the vagina. No erythema or bleeding in the vagina. No foreign body around the vagina. No signs of injury around the vagina. Vaginal discharge found.  minor cervical motion tenderness. Slight inflammation to labia major bilateral. No vesicles, surrounding warmth, or redness.   Musculoskeletal: Normal range of motion. She exhibits no edema or tenderness.  Lymphadenopathy:    She has no cervical adenopathy.  Neurological: She is alert and oriented to person, place, and time. She has normal strength. She displays no atrophy and no tremor. No cranial nerve deficit or sensory deficit. She exhibits normal muscle tone. She displays a negative Romberg sign. She displays no seizure activity. Coordination and gait normal. GCS eye subscore is 4. GCS verbal subscore is 5. GCS motor subscore is 6.  Reflex Scores:      Patellar reflexes are 2+ on the right side and 2+ on the left side. Skin: Skin is warm and dry. She is not diaphoretic.  Psychiatric: She has a normal mood and affect. Her behavior is normal.  Nursing note and vitals reviewed.   ED Course  Procedures (including critical care time)  DIAGNOSTIC STUDIES: Oxygen Saturation is 100% on RA, normal by my  interpretation.    COORDINATION OF CARE: 9:30 PM - Pt made aware of UA results positive for UTI. Discussed treatment plan with pt at bedside which includes Rx antibiotics for UTI. Advised f/u with PCP in 3 days if symptoms persist or worsen despite treatment. Pt verbalized understanding and agreed to plan.   Labs Review Labs Reviewed  WET PREP, GENITAL - Abnormal; Notable for the following:    Clue Cells Wet Prep HPF POC PRESENT (*)    WBC, Wet Prep HPF POC MANY (*)    All other components within normal limits  URINALYSIS, ROUTINE W REFLEX MICROSCOPIC (NOT AT [facility]) - Abnormal; Notable for the following:    Color, Urine AMBER (*)    APPearance CLOUDY (*)    Ketones, ur 15 (*)    Nitrite POSITIVE (*)    Leukocytes, UA TRACE (*)    All other components within normal limits  URINE MICROSCOPIC-ADD ON - Abnormal; Notable for the following:    Squamous Epithelial / LPF 0-5 (*)    Bacteria, UA MANY (*)    All other components  within normal limits  URINE CULTURE  RPR  HIV ANTIBODY (ROUTINE TESTING)  POC URINE PREG, ED  GC/CHLAMYDIA PROBE AMP ([place]) NOT AT [facility]    MDM   Final diagnoses:  PID (acute pelvic inflammatory disease)    Labs: UA, Urine Microscope Add-On, POC Preg- clue cells  Imaging:  Consults:  Therapeutics:ceftriaxone,Toradol, Tylenol, Reglan, Benadryl  Discharge Meds: doxycycline  Assessment/Plan:  Patient's presentation today is most consistent with pelvic inflammatory disease. She had a low-grade fever, copious vaginal discharge and very minor cervical motion tenderness. Patient originally reported no vaginal discharge, stating that she was seen at the health department with no significant findings on her pelvic exam. Patient had a fever, nitrite positive urine but had 0-5 wbc's and trace leukocytes She had no urinary complaints, CVA tenderness. Repeat vaginal exam shows significant discharge consistent with STD. additionally the patient has tender  lymphadenopathy of the bilateral inguinal lymph nodes. Patient is nontoxic appearing, no abdominal tenderness, with no nausea or vomiting, tolerating by mouth without difficulty. She has reassuring vital signs, fever improved with oral Tylenol. Patient given ceftriaxone IV here in the ED, she'll be prescribed doxycycline for home use, and is instructed to follow-up with primary care in 2 days for reevaluation. No need for laboratory analysis as this will not change in patient treatment and disposition.She also has a headache, this is slow onset,very minor, with no red flags. Patient has full active range of motion of the neck, no altered mental status, low suspicion for meningitis, low suspicion for subarachnoid hemorrhage or any other concerning intracranial abnormality.Pain resolved with above medications. She is instructed return immediately to the emergency room if any new or worsening signs or symptoms present.Patient is an otherwise healthy young female, she will likely respond well to outpatient treatment and is assured her follow-up evaluation.   .I personally performed the services described in this documentation, which was scribed in my presence. The recorded information has been reviewed and is accurate.  b  Eyvonne Mechanic, PA-C 04/04/15 2302  Eyvonne Mechanic, PA-C 04/04/15 1610  Mancel Bale, MD 04/05/15 [phone]

## 2015-04-05 LAB — HIV ANTIBODY (ROUTINE TESTING W REFLEX): HIV Screen 4th Generation wRfx: NONREACTIVE

## 2015-04-05 LAB — GC/CHLAMYDIA PROBE AMP (~~LOC~~) NOT AT ARMC
Chlamydia: NEGATIVE
Neisseria Gonorrhea: NEGATIVE

## 2015-04-05 LAB — RPR: RPR Ser Ql: NONREACTIVE

## 2015-04-07 LAB — URINE CULTURE
Culture: >=100000
Special Requests: NORMAL

## 2015-04-08 ENCOUNTER — Telehealth: Payer: Self-pay | Admitting: *Deleted

## 2015-04-08 ENCOUNTER — Telehealth (HOSPITAL_COMMUNITY): Payer: Self-pay

## 2015-04-08 NOTE — Telephone Encounter (Signed)
Post ED Visit - Positive Culture Follow-up: Chart Hand-off to ED Flow Manager  Culture assessed and recommendations reviewed by: []  Isaac BlissMichael Maccia, Pharm.D., BCPS []  Celedonio MiyamotoJeremy Frens, Pharm.D., BCPS-AQ ID []  Georgina PillionElizabeth Martin, Pharm.D., BCPS []  KingstonMinh Pham, VermontPharm.D., BCPS, AAHIVP []  Estella HuskMichelle Turner, Pharm .D., BCPS, AAHIVP []  Tennis Mustassie Stewart, Pharm.D. [x]  corey ball pharm d Casilda Carlsaylor Stone, Pharm.D.  Positive urine culture  [x]  Patient discharged without antimicrobial prescription and treatment is now indicated []  Organism is resistant to prescribed ED discharge antimicrobial []  Patient with positive blood cultures  Changes discussed with ED provider: Mayme GentaBen Cartner PA New antibiotic prescription macrobid 1 capsule    Ashley JacobsFesterman, Frederik Standley C 04/08/2015, 11:41 AM

## 2015-04-08 NOTE — Telephone Encounter (Signed)
Patient was seen 04/04/2015 for PID and given Rx for Doxycycline which she pd $80.00 for as she has NO [employer] Insurance. Of course she would like to return some of the medication as she no longer needs it.... Today cultures returned and ED called to change her RX. CM was consulted as pt has NO money to fill new RX, originally $30.00 which can be filled for $11.00 with GoodRx coupon. Pt states she has NO cash. Will provide MATCH for pt with definite instructions to complete entire prescription, 2) Given option of Health Dept in the future for similar symptoms at reduced fee for both service and Rx.  Pt appreciative of information and Match Understands that this is Once/12 month period.

## 2015-04-08 NOTE — Progress Notes (Signed)
ED Antimicrobial Stewardship Positive Culture Follow Up   Bartholomew CrewsFadricka Dodson is an 20 y.o. female who [facility] with a chief complaint of  Chief Complaint  Patient presents with  . Headache  . Pelvic Pain    Recent Results (from the past 720 hour(s))  Urine culture     Status: None   Collection Time: 04/04/15  8:17 PM  Result Value Ref Range Status   Specimen Description URINE, CLEAN CATCH  Final   Special Requests Normal  Final   Culture >=100,000 COLONIES/mL ESCHERICHIA COLI  Final   Report Status 04/07/2015 FINAL  Final   Organism ID, Bacteria ESCHERICHIA COLI  Final      Susceptibility   Escherichia coli - MIC*    AMPICILLIN <=2 SENSITIVE Sensitive     CEFAZOLIN <=4 SENSITIVE Sensitive     CEFTRIAXONE <=1 SENSITIVE Sensitive     CIPROFLOXACIN <=0.25 SENSITIVE Sensitive     GENTAMICIN <=1 SENSITIVE Sensitive     IMIPENEM <=0.25 SENSITIVE Sensitive     NITROFURANTOIN <=16 SENSITIVE Sensitive     TRIMETH/SULFA <=20 SENSITIVE Sensitive     AMPICILLIN/SULBACTAM <=2 SENSITIVE Sensitive     PIP/TAZO <=4 SENSITIVE Sensitive     * >=100,000 COLONIES/mL ESCHERICHIA COLI  Wet prep, genital     Status: Abnormal   Collection Time: 04/04/15 10:30 PM  Result Value Ref Range Status   Yeast Wet Prep HPF POC NONE SEEN NONE SEEN Final   Trich, Wet Prep NONE SEEN NONE SEEN Final   Clue Cells Wet Prep HPF POC PRESENT (A) NONE SEEN Final   WBC, Wet Prep HPF POC MANY (A) NONE SEEN Final   Sperm NONE SEEN  Final    [x]  Treated with doxycycline, organism resistant to prescribed antimicrobial []  Patient discharged originally without antimicrobial agent and treatment is now indicated  New antibiotic prescription: Macrobid 100mg  1 capsule BID for 5 days  ED Provider: Mayme GentaBen Cartner, PA  Arlean Hoppingorey M. Newman PiesBall, PharmD, BCPS Clinical Pharmacist Pager [phone] 04/08/2015, 8:59 AM Infectious Diseases Pharmacist Phone# [phone]

## 2015-04-09 ENCOUNTER — Encounter (HOSPITAL_COMMUNITY): Payer: Self-pay | Admitting: Family Medicine

## 2015-04-09 ENCOUNTER — Emergency Department (HOSPITAL_COMMUNITY)
Admission: EM | Admit: 2015-04-09 | Discharge: 2015-04-09 | Disposition: A | Discharge status: Home / Self Care | Payer: Medicaid Other | Attending: Emergency Medicine | Admitting: Emergency Medicine

## 2015-04-09 DIAGNOSIS — N760 Acute vaginitis: Secondary | ICD-10-CM | POA: Insufficient documentation

## 2015-04-09 DIAGNOSIS — Z79899 Other long term (current) drug therapy: Secondary | ICD-10-CM | POA: Insufficient documentation

## 2015-04-09 DIAGNOSIS — B9689 Other specified bacterial agents as the cause of diseases classified elsewhere: Secondary | ICD-10-CM

## 2015-04-09 DIAGNOSIS — Z792 Long term (current) use of antibiotics: Secondary | ICD-10-CM | POA: Insufficient documentation

## 2015-04-09 DIAGNOSIS — A6000 Herpesviral infection of urogenital system, unspecified: Secondary | ICD-10-CM

## 2015-04-09 DIAGNOSIS — R590 Localized enlarged lymph nodes: Secondary | ICD-10-CM | POA: Insufficient documentation

## 2015-04-09 DIAGNOSIS — Z87440 Personal history of urinary (tract) infections: Secondary | ICD-10-CM | POA: Insufficient documentation

## 2015-04-09 DIAGNOSIS — Z872 Personal history of diseases of the skin and subcutaneous tissue: Secondary | ICD-10-CM | POA: Insufficient documentation

## 2015-04-09 DIAGNOSIS — A6004 Herpesviral vulvovaginitis: Secondary | ICD-10-CM | POA: Insufficient documentation

## 2015-04-09 MED ORDER — METRONIDAZOLE 500 MG PO TABS
500.0000 mg | ORAL_TABLET | Freq: Once | ORAL | Status: AC
  Administered 2015-04-09: 500 mg via ORAL
  Filled 2015-04-09: qty 1

## 2015-04-09 MED ORDER — ONDANSETRON 4 MG PO TBDP
4.0000 mg | ORAL_TABLET | Freq: Once | ORAL | Status: AC
  Administered 2015-04-09: 4 mg via ORAL
  Filled 2015-04-09: qty 1

## 2015-04-09 MED ORDER — TRAMADOL HCL 50 MG PO TABS: 50.0000 mg | ORAL_TABLET | Freq: Four times a day (QID) | ORAL | Status: DC | PRN

## 2015-04-09 MED ORDER — ACYCLOVIR 400 MG PO TABS
400.0000 mg | ORAL_TABLET | Freq: Three times a day (TID) | ORAL | Status: AC
Start: 2015-04-09 — End: 2015-04-19

## 2015-04-09 MED ORDER — METRONIDAZOLE 500 MG PO TABS: 500.0000 mg | ORAL_TABLET | Freq: Two times a day (BID) | ORAL | Status: AC

## 2015-04-09 MED ORDER — VALACYCLOVIR HCL 500 MG PO TABS
1000.0000 mg | ORAL_TABLET | Freq: Two times a day (BID) | ORAL | Status: DC
  Administered 2015-04-09: 1000 mg via ORAL
  Filled 2015-04-09 (×2): qty 2

## 2015-04-09 NOTE — ED Provider Notes (Signed)
CSN: [id number]     Arrival date & time 04/09/15  1037 History   First MD Initiated Contact with Patient 04/09/15 1313     Chief Complaint  Patient presents with  . Vaginal Discharge     (Consider location/radiation/quality/duration/timing/severity/associated sxs/prior Treatment) HPI   Bethany Dodson is a 20 y.o. female who presents to the ER with worsening vaginal irritation and pain with increased swelling and raw sores on external genitalia.  Pt was seen 5 days ago and was treated for PID, then called 3 days ago and she began treatment for UTI.  She has not had any improvement in her vaginal discharge.  She continues to have pain with urination, although she does not have as much burning with urination, but rather severe pain if urine touches her skin.  She states the pain is so severe, rated 10/10, described as stabbing and burning, she can barely walk, and she has been unable to put undergarments and normal clothes on, due to pain.  She has tried to "air it out" but has seen no improvement. The pt states that when she was recently seen, she did not have any broken or irriated skin, but she did have mild swelling and discomfort.  She states she was treated for PID because of palpable inguinal lymphnodes.  They were not tender at her prior visit, but have enlarged and are very painful to touch today.   She has one partner, and believes he is "clean."  He has had some "pimples on his penis, but that's all."   The pt and her boyfriend deny hx of herpes.  Her STD testing came back negative for GC, Chlamydia, Syphilis and HIV.  She was originally given doxy to tx for PID, then was called in bactrim to treat UTI.  Her vaginal discharge has increased in volume and is foul smelling.   She denies abdominal pain, N, V, D.  Past Medical History  Diagnosis Date  . Eczema    History reviewed. No pertinent past surgical history. History reviewed. No pertinent family history. Social History  Substance  Use Topics  . Smoking status: Never Smoker   . Smokeless tobacco: None  . Alcohol Use: No   OB History    No data available     Review of Systems  Constitutional: Negative.   Eyes: Negative.   Cardiovascular: Negative.   Gastrointestinal: Negative.   Endocrine: Negative.   Genitourinary: Positive for vaginal discharge, genital sores and vaginal pain. Negative for urgency, frequency, hematuria, flank pain, decreased urine volume, vaginal bleeding, enuresis, menstrual problem and pelvic pain.  Musculoskeletal: Negative.   Neurological: Negative.   Hematological: Negative.   Psychiatric/Behavioral: Negative.   All other systems reviewed and are negative.     Allergies  Review of patient's allergies indicates no known allergies.  Home Medications   Prior to Admission medications   Medication Sig Start Date End Date Taking? Authorizing Provider  acyclovir (ZOVIRAX) 400 MG tablet Take 1 tablet (400 mg total) by mouth 3 (three) times daily. 04/09/15 04/19/15  Danelle Berry, PA-C  cephALEXin (KEFLEX) 500 MG capsule Take 1 capsule (500 mg total) by mouth 2 (two) times daily. Patient not taking: Reported on 04/04/2015 07/17/14   Rodolph Bong, MD  diphenhydrAMINE (BENADRYL) 25 mg capsule Take 1 capsule (25 mg total) by mouth every 4 (four) hours as needed for allergies. Patient not taking: Reported on 04/04/2015 04/09/14   Fayrene Helper, PA-C  doxycycline (VIBRAMYCIN) 100 MG capsule Take 1 capsule (  100 mg total) by mouth 2 (two) times daily. 04/04/15   Eyvonne Mechanic, PA-C  famotidine (PEPCID) 20 MG tablet Take 1 tablet (20 mg total) by mouth 2 (two) times daily. Patient not taking: Reported on 04/04/2015 04/09/14   Fayrene Helper, PA-C  guaiFENesin (ROBITUSSIN) 100 MG/5ML liquid Take 5-10 mLs (100-200 mg total) by mouth every 4 (four) hours as needed for cough. Patient not taking: Reported on 04/04/2015 05/24/14   Junius Finner, PA-C  ibuprofen (ADVIL,MOTRIN) 600 MG tablet Take 1 tablet (600 mg total) by  mouth every 6 (six) hours as needed. Patient not taking: Reported on 04/04/2015 05/24/14   Junius Finner, PA-C  metroNIDAZOLE (FLAGYL) 500 MG tablet Take 1 tablet (500 mg total) by mouth 2 (two) times daily. 04/09/15 04/16/15  Danelle Berry, PA-C  sodium chloride (OCEAN) 0.65 % SOLN nasal spray Place 1 spray into both nostrils as needed for congestion. Patient not taking: Reported on 04/04/2015 05/24/14   Junius Finner, PA-C  traMADol (ULTRAM) 50 MG tablet Take 1 tablet (50 mg total) by mouth every 6 (six) hours as needed. 04/09/15   Danelle Berry, PA-C  triamcinolone ointment (KENALOG) 0.5 % Apply 1 application topically 2 (two) times daily. Patient not taking: Reported on 04/04/2015 07/17/14   Rodolph Bong, MD   BP 117/75 mmHg  Pulse 65  Temp(Src) 98.9 F (37.2 C) (Oral)  Resp 18  Ht 5\' 5"  (1.651 m)  Wt 52.617 kg  BMI 19.30 kg/m2  SpO2 100%  LMP 03/21/2015 Physical Exam  Constitutional: She is oriented to person, place, and time. She appears well-developed and well-nourished. No distress.  Appears uncomfortable, NAD  HENT:  Head: Normocephalic and atraumatic.  Right Ear: External ear normal.  Left Ear: External ear normal.  Nose: Nose normal.  Mouth/Throat: Oropharynx is clear and moist. No oropharyngeal exudate.  Eyes: Conjunctivae and EOM are normal. Pupils are equal, round, and reactive to light. Right eye exhibits no discharge. Left eye exhibits no discharge. No scleral icterus.  Neck: Normal range of motion. Neck supple. No JVD present. No tracheal deviation present.  Cardiovascular: Normal rate and regular rhythm.   Pulmonary/Chest: Effort normal and breath sounds normal. No stridor. No respiratory distress.  Abdominal: Soft. Bowel sounds are normal. She exhibits no distension and no mass. There is no tenderness. There is no rebound and no guarding. Hernia confirmed negative in the right inguinal area and confirmed negative in the left inguinal area.  Genitourinary: There is rash,  tenderness and lesion on the right labia. There is rash, tenderness and lesion on the left labia. Vaginal discharge found.  Tender, swollen, erythematous bilateral labia majora, multiple ulceration. Copious foul smelling white to yellow discharge. No erythematous papular rash, no satellite lesions  Musculoskeletal: Normal range of motion. She exhibits no edema.  Lymphadenopathy:    She has no cervical adenopathy.       Right: Inguinal adenopathy present.       Left: Inguinal adenopathy present.  Neurological: She is alert and oriented to person, place, and time. She exhibits normal muscle tone. Coordination normal.  Skin: Skin is warm and dry. No rash noted. She is not diaphoretic. No erythema. No pallor.  Psychiatric: She has a normal mood and affect. Her behavior is normal. Judgment and thought content normal.  Nursing note and vitals reviewed.   ED Course  Procedures (including critical care time) Labs Review Labs Reviewed - No data to display   Prior ER visit documentation and results reviewed:  Recent Results (  from the past 240 hour(s))  Urine culture     Status: None   Collection Time: 04/04/15  8:17 PM  Result Value Ref Range Status   Specimen Description URINE, CLEAN CATCH  Final   Special Requests Normal  Final   Culture >=100,000 COLONIES/mL ESCHERICHIA COLI  Final   Report Status 04/07/2015 FINAL  Final   Organism ID, Bacteria ESCHERICHIA COLI  Final      Susceptibility   Escherichia coli - MIC*    AMPICILLIN <=2 SENSITIVE Sensitive     CEFAZOLIN <=4 SENSITIVE Sensitive     CEFTRIAXONE <=1 SENSITIVE Sensitive     CIPROFLOXACIN <=0.25 SENSITIVE Sensitive     GENTAMICIN <=1 SENSITIVE Sensitive     IMIPENEM <=0.25 SENSITIVE Sensitive     NITROFURANTOIN <=16 SENSITIVE Sensitive     TRIMETH/SULFA <=20 SENSITIVE Sensitive     AMPICILLIN/SULBACTAM <=2 SENSITIVE Sensitive     PIP/TAZO <=4 SENSITIVE Sensitive     * >=100,000 COLONIES/mL ESCHERICHIA COLI  Wet prep,  genital     Status: Abnormal   Collection Time: 04/04/15 10:30 PM  Result Value Ref Range Status   Yeast Wet Prep HPF POC NONE SEEN NONE SEEN Final   Trich, Wet Prep NONE SEEN NONE SEEN Final   Clue Cells Wet Prep HPF POC PRESENT (A) NONE SEEN Final   WBC, Wet Prep HPF POC MANY (A) NONE SEEN Final   Sperm NONE SEEN  Final     Imaging Review No results found. I have personally reviewed and evaluated these images and lab results as part of my medical decision-making.   EKG Interpretation None      MDM   Pt with worsening vulvovaginitis with external genital redness, pain, swelling, ulcers, lesions, and worsening bilateral inguinal lymphadenopathy.  Clinically appears consistent with genital herpes. Pt treated with valtrex in ER, she stated she had no money so Rx was acyclovir ($4 [facility] list).  The pt refused repeat pelvic exam, because of pain, she had no new sexual partners, so no indication to repeat pelvic exam.  Visible vaginal discharge suspicious for BV, and review of recent wet prep shows +clue cells.  Pt treated with flagyl. She will continue tx for UTI with bactrim.  Pt given pain meds.  Pt and her boyfriend were educated re: genital herpes, tx and transmission.  He was encouraged to f/up with his PCP.  The pt d/c home in good condition, pain managed in the ER.  VSS. Pt w/o fever, abdominal pain, N, V.  Able to tolerate PO's in ER.   Pt ambulated out of ER w/o difficulty.  Work note given.  Pt encouraged to f/up with PCP.  Return precautions reviewed with pt who verbalizes understanding.   Final diagnoses:  BV (bacterial vaginosis)  Genital herpes     Danelle BerryLeisa Aryona Sill, PA-C 04/11/15 [id number]  Vanetta MuldersScott Zackowski, MD 04/12/15 1225

## 2015-04-09 NOTE — Discharge Instructions (Signed)
Genital Herpes Genital herpes is a common sexually transmitted infection (STI) that is caused by a virus. The virus is spread from person to person through sexual contact. Infection can cause itching, blisters, and sores in the genital area or rectal area. This is called an outbreak. It affects both men and women. Genital herpes is particularly concerning for pregnant women because the virus can be passed to the baby during delivery and cause serious problems. Genital herpes is also a concern for people with a weakened defense (immune) system. Symptoms of genital herpes may last several days and then go away. However, the virus remains in your body, so you may have more outbreaks of symptoms in the future. The time between outbreaks varies and can be months or years. CAUSES Genital herpes is caused by a virus called herpes simplex virus (HSV) type 2 or HSV type 1. These viruses are contagious and are most often spread through sexual contact with an infected person. Sexual contact includes vaginal, anal, and oral sex. RISK FACTORS Risk factors for genital herpes include:  Being sexually active with multiple partners.  Having unprotected sex. SIGNS AND SYMPTOMS Symptoms may include:  Pain and itching in the genital area or rectal area.  Small red bumps that turn into blisters and then turn into sores.  Flu-like symptoms, including:  Fever.  Body aches.  Painful urination.  Vaginal discharge. DIAGNOSIS Genital herpes may be diagnosed by:  Physical exam.  Blood test.  Fluid culture test from an open sore. TREATMENT There is no cure for genital herpes. Oral antiviral medicines may be used to speed up healing and to help prevent the return of symptoms. These medicines can also help to reduce the spread of the virus to sexual partners. HOME CARE INSTRUCTIONS  Keep the affected areas dry and clean.  Take medicines only as directed by your health care provider.  Do not have sexual  contact during active infections. Genital herpes is contagious.  Practice safe sex. Latex condoms and female condoms may help to prevent the spread of the herpes virus.  Avoid rubbing or touching the blisters and sores. If you do touch the blister or sores:  Wash your hands thoroughly.  Do not touch your eyes afterward.  If you become pregnant, tell your health care provider if you have had genital herpes.  Keep all follow-up visits as directed by your health care provider. This is important. PREVENTION  Use condoms. Although anyone can contract genital herpes during sexual contact even with the use of a condom, a condom can provide some protection.  Avoid having multiple sexual partners.  Talk to your sexual partner about any symptoms and past history that either of you may have.  Get tested before you have sex. Ask your partner to do the same.  Recognize the symptoms of genital herpes. Do not have sexual contact if you notice these symptoms. SEEK MEDICAL CARE IF:  Your symptoms are not improving with medicine.  Your symptoms return.  You have new symptoms.  You have a fever.  You have abdominal pain.  You have redness, swelling, or pain in your eye. MAKE SURE YOU:  Understand these instructions.  Will watch your condition.  Will get help right away if you are not doing well or get worse.   This information is not intended to replace advice given to you by your health care provider. Make sure you discuss any questions you have with your health care provider.   Document Released: 03/07/2000  Document Revised: 03/31/2014 Document Reviewed: 07/26/2013 Elsevier Interactive Patient Education 2016 [employer] Inc[employer]  Bacterial Vaginosis Bacterial vaginosis is a vaginal infection that occurs when the normal balance of bacteria in the vagina is disrupted. It results from an overgrowth of certain bacteria. This is the most common vaginal infection in women of childbearing age.  Treatment is important to prevent complications, especially in pregnant women, as it can cause a premature delivery. CAUSES  Bacterial vaginosis is caused by an increase in harmful bacteria that are normally present in smaller amounts in the vagina. Several different kinds of bacteria can cause bacterial vaginosis. However, the reason that the condition develops is not fully understood. RISK FACTORS Certain activities or behaviors can put you at an increased risk of developing bacterial vaginosis, including:  Having a new sex partner or multiple sex partners.  Douching.  Using an intrauterine device (IUD) for contraception. Women do not get bacterial vaginosis from toilet seats, bedding, swimming pools, or contact with objects around them. SIGNS AND SYMPTOMS  Some women with bacterial vaginosis have no signs or symptoms. Common symptoms include:  Grey vaginal discharge.  A fishlike odor with discharge, especially after sexual intercourse.  Itching or burning of the vagina and vulva.  Burning or pain with urination. DIAGNOSIS  Your health care provider will take a medical history and examine the vagina for signs of bacterial vaginosis. A sample of vaginal fluid may be taken. Your health care provider will look at this sample under a microscope to check for bacteria and abnormal cells. A vaginal pH test may also be done.  TREATMENT  Bacterial vaginosis may be treated with antibiotic medicines. These may be given in the form of a pill or a vaginal cream. A second round of antibiotics may be prescribed if the condition comes back after treatment. Because bacterial vaginosis increases your risk for sexually transmitted diseases, getting treated can help reduce your risk for chlamydia, gonorrhea, HIV, and herpes. HOME CARE INSTRUCTIONS   Only take over-the-counter or prescription medicines as directed by your health care provider.  If antibiotic medicine was prescribed, take it as directed.  Make sure you finish it even if you start to feel better.  Tell all sexual partners that you have a vaginal infection. They should see their health care provider and be treated if they have problems, such as a mild rash or itching.  During treatment, it is important that you follow these instructions:  Avoid sexual activity or use condoms correctly.  Do not douche.  Avoid alcohol as directed by your health care provider.  Avoid breastfeeding as directed by your health care provider. SEEK MEDICAL CARE IF:   Your symptoms are not improving after 3 days of treatment.  You have increased discharge or pain.  You have a fever. MAKE SURE YOU:   Understand these instructions.  Will watch your condition.  Will get help right away if you are not doing well or get worse. FOR MORE INFORMATION  Centers for Disease Control and Prevention, Division of STD Prevention: [employer] American Sexual Health Association (ASHA): www.ashastd.org    This information is not intended to replace advice given to you by your health care provider. Make sure you discuss any questions you have with your health care provider.   Document Released: 03/10/2005 Document Revised: 03/31/2014 Document Reviewed: 10/20/2012 Elsevier Interactive Patient Education 2016 [employer].  Vaginitis Vaginitis is an inflammation of the vagina. It is most often caused by a change in the normal balance of  the bacteria and yeast that live in the vagina. This change in balance causes an overgrowth of certain bacteria or yeast, which causes the inflammation. There are different types of vaginitis, but the most common types are:  Bacterial vaginosis.  Yeast infection (candidiasis).  Trichomoniasis vaginitis. This is a sexually transmitted infection (STI).  Viral vaginitis.  Atrophic vaginitis.  Allergic vaginitis. CAUSES  The cause depends on the type of vaginitis. Vaginitis can be caused by:  Bacteria (bacterial  vaginosis).  Yeast (yeast infection).  A parasite (trichomoniasis vaginitis)  A virus (viral vaginitis).  Low hormone levels (atrophic vaginitis). Low hormone levels can occur during pregnancy, breastfeeding, or after menopause.  Irritants, such as bubble baths, scented tampons, and feminine sprays (allergic vaginitis). Other factors can change the normal balance of the yeast and bacteria that live in the vagina. These include:  Antibiotic medicines.  Poor hygiene.  Diaphragms, vaginal sponges, spermicides, birth control pills, and intrauterine devices (IUD).  Sexual intercourse.  Infection.  Uncontrolled diabetes.  A weakened immune system. SYMPTOMS  Symptoms can vary depending on the cause of the vaginitis. Common symptoms include:  Abnormal vaginal discharge.  The discharge is white, gray, or yellow with bacterial vaginosis.  The discharge is thick, white, and cheesy with a yeast infection.  The discharge is frothy and yellow or greenish with trichomoniasis.  A bad vaginal odor.  The odor is fishy with bacterial vaginosis.  Vaginal itching, pain, or swelling.  Painful intercourse.  Pain or burning when urinating. Sometimes, there are no symptoms. TREATMENT  Treatment will vary depending on the type of infection.   Bacterial vaginosis and trichomoniasis are often treated with antibiotic creams or pills.  Yeast infections are often treated with antifungal medicines, such as vaginal creams or suppositories.  Viral vaginitis has no cure, but symptoms can be treated with medicines that relieve discomfort. Your sexual partner should be treated as well.  Atrophic vaginitis may be treated with an estrogen cream, pill, suppository, or vaginal ring. If vaginal dryness occurs, lubricants and moisturizing creams may help. You may be told to avoid scented soaps, sprays, or douches.  Allergic vaginitis treatment involves quitting the use of the product that is causing  the problem. Vaginal creams can be used to treat the symptoms. HOME CARE INSTRUCTIONS   Take all medicines as directed by your caregiver.  Keep your genital area clean and dry. Avoid soap and only rinse the area with water.  Avoid douching. It can remove the healthy bacteria in the vagina.  Do not use tampons or have sexual intercourse until your vaginitis has been treated. Use sanitary pads while you have vaginitis.  Wipe from front to back. This avoids the spread of bacteria from the rectum to the vagina.  Let air reach your genital area.  Wear cotton underwear to decrease moisture buildup.  Avoid wearing underwear while you sleep until your vaginitis is gone.  Avoid tight pants and underwear or nylons without a cotton panel.  Take off wet clothing (especially bathing suits) as soon as possible.  Use mild, non-scented products. Avoid using irritants, such as:  Scented feminine sprays.  Fabric softeners.  Scented detergents.  Scented tampons.  Scented soaps or bubble baths.  Practice safe sex and use condoms. Condoms may prevent the spread of trichomoniasis and viral vaginitis. SEEK MEDICAL CARE IF:   You have abdominal pain.  You have a fever or persistent symptoms for more than 2-3 days.  You have a fever and your symptoms suddenly  get worse.   This information is not intended to replace advice given to you by your health care provider. Make sure you discuss any questions you have with your health care provider.   Document Released: 01/05/2007 Document Revised: 07/25/2014 Document Reviewed: 08/21/2011 Elsevier Interactive Patient Education [employer]

## 2015-04-09 NOTE — ED Notes (Signed)
Pt here for vaginal pain, discharge and bumps in vaginal area. sts recently treated for PID.

## 2015-04-09 NOTE — ED Notes (Signed)
Patient states she was here on 1/11 had a pelvic exam and was dx. With PID , states she got her medication filled and was called back and told she didn't have PID  She had a UTI, currently taking medication without relief. States her vag. Discharge is getting worse and very painful with urination. States she has bumps .

## 2015-05-24 ENCOUNTER — Emergency Department (HOSPITAL_COMMUNITY)
Admission: EM | Admit: 2015-05-24 | Discharge: 2015-05-24 | Disposition: A | Discharge status: Home / Self Care | Payer: Medicaid Other | Attending: Emergency Medicine | Admitting: Emergency Medicine

## 2015-05-24 ENCOUNTER — Encounter (HOSPITAL_COMMUNITY): Payer: Self-pay | Admitting: Emergency Medicine

## 2015-05-24 DIAGNOSIS — Z792 Long term (current) use of antibiotics: Secondary | ICD-10-CM | POA: Insufficient documentation

## 2015-05-24 DIAGNOSIS — R21 Rash and other nonspecific skin eruption: Secondary | ICD-10-CM | POA: Insufficient documentation

## 2015-05-24 DIAGNOSIS — L509 Urticaria, unspecified: Secondary | ICD-10-CM | POA: Insufficient documentation

## 2015-05-24 DIAGNOSIS — R51 Headache: Secondary | ICD-10-CM | POA: Insufficient documentation

## 2015-05-24 MED ORDER — DIPHENHYDRAMINE HCL 25 MG PO TABS: 25.0000 mg | ORAL_TABLET | Freq: Four times a day (QID) | ORAL | Status: AC | PRN

## 2015-05-24 NOTE — ED Notes (Signed)
Pt states she woke up this morning with raised bumps on her forehead. Pt states she had one before that just went away. Pt states they itch but denies any pain. Pt does report a mild headache since yesterday denies any n/v.

## 2015-05-24 NOTE — Discharge Instructions (Signed)
Take the prescribed medication as directed.  Use caution, benadryl can make you sleepy/drowsy. Follow-up with your primary care doctor. Return to the ED for new or worsening symptoms.

## 2015-05-24 NOTE — ED Provider Notes (Signed)
CSN: [id number]     Arrival date & time 05/24/15  1555 History  By signing my name below, I, Phillis Haggis, attest that this documentation has been prepared under the direction and in the presence of Sharilyn Sites, PA-C Electronically Signed: Phillis Haggis, ED Scribe. 05/24/2015. 4:23 PM.  Chief Complaint  Patient presents with  . Rash   The history is provided by the patient. No language interpreter was used.  HPI Comments: Esma Kilts is a 20 y.o. female who presents to the Emergency Department complaining of slightly itching raised bumps to the right forehead onset earlier this morning. Pt states that she has been having a mild headache since yesterday and woke up with the bumps on her head. She has had similar symptoms in the past, but that case went away on its own. She has not taken anything for her symptoms PTA. She denies use of new makeup, facial wash, new medications, pain to the area, SOB, nausea, or vomiting.  Past Medical History  Diagnosis Date  . Eczema    History reviewed. No pertinent past surgical history. No family history on file. Social History  Substance Use Topics  . Smoking status: Never Smoker   . Smokeless tobacco: None  . Alcohol Use: No   OB History    No data available     Review of Systems  Respiratory: Negative for shortness of breath.   Gastrointestinal: Negative for nausea and vomiting.  Skin: Positive for rash.  Neurological: Positive for headaches.  All other systems reviewed and are negative.  Allergies  Review of patient's allergies indicates no known allergies.  Home Medications   Prior to Admission medications   Medication Sig Start Date End Date Taking? Authorizing Provider  cephALEXin (KEFLEX) 500 MG capsule Take 1 capsule (500 mg total) by mouth 2 (two) times daily. Patient not taking: Reported on 04/04/2015 07/17/14   Rodolph Bong, MD  diphenhydrAMINE (BENADRYL) 25 mg capsule Take 1 capsule (25 mg total) by mouth every 4 (four)  hours as needed for allergies. Patient not taking: Reported on 04/04/2015 04/09/14   Fayrene Helper, PA-C  doxycycline (VIBRAMYCIN) 100 MG capsule Take 1 capsule (100 mg total) by mouth 2 (two) times daily. 04/04/15   Eyvonne Mechanic, PA-C  famotidine (PEPCID) 20 MG tablet Take 1 tablet (20 mg total) by mouth 2 (two) times daily. Patient not taking: Reported on 04/04/2015 04/09/14   Fayrene Helper, PA-C  guaiFENesin (ROBITUSSIN) 100 MG/5ML liquid Take 5-10 mLs (100-200 mg total) by mouth every 4 (four) hours as needed for cough. Patient not taking: Reported on 04/04/2015 05/24/14   Junius Finner, PA-C  ibuprofen (ADVIL,MOTRIN) 600 MG tablet Take 1 tablet (600 mg total) by mouth every 6 (six) hours as needed. Patient not taking: Reported on 04/04/2015 05/24/14   Junius Finner, PA-C  sodium chloride (OCEAN) 0.65 % SOLN nasal spray Place 1 spray into both nostrils as needed for congestion. Patient not taking: Reported on 04/04/2015 05/24/14   Junius Finner, PA-C  traMADol (ULTRAM) 50 MG tablet Take 1 tablet (50 mg total) by mouth every 6 (six) hours as needed. 04/09/15   Danelle Berry, PA-C  triamcinolone ointment (KENALOG) 0.5 % Apply 1 application topically 2 (two) times daily. Patient not taking: Reported on 04/04/2015 07/17/14   Rodolph Bong, MD   BP 114/74 mmHg  Pulse 67  Temp(Src) 98.3 F (36.8 C) (Oral)  Resp 16  Ht  (1.651 m)  Wt 115 lb (52.164 kg)  BMI 19.14  kg/m2  SpO2 100%   Physical Exam  Constitutional: She is oriented to person, place, and time. She appears well-developed and well-nourished.  HENT:  Head: Normocephalic and atraumatic.  Mouth/Throat: Oropharynx is clear and moist.  Airway clear, no oral lesions  Eyes: Conjunctivae and EOM are normal. Pupils are equal, round, and reactive to light.  Neck: Normal range of motion.  Cardiovascular: Normal rate, regular rhythm and normal heart sounds.   Pulmonary/Chest: Effort normal and breath sounds normal.  Abdominal: Soft. Bowel sounds are  normal.  Musculoskeletal: Normal range of motion.  Neurological: She is alert and oriented to person, place, and time.  Skin: Skin is warm and dry.  Very small patch of hives to right forehead/temple; some areas of acne noted as well; no signs of superimposed infection or cellulitis at this time; no drainage  Psychiatric: She has a normal mood and affect.  Nursing note and vitals reviewed.   ED Course  Procedures (including critical care time) DIAGNOSTIC STUDIES: Oxygen Saturation is 100% on RA, normal by my interpretation.    COORDINATION OF CARE: 4:23 PM-Discussed treatment plan which includes Benadryl with pt at bedside and pt agreed to plan.    Labs Review Labs Reviewed - No data to display  Imaging Review No results found.    EKG Interpretation None      MDM   Final diagnoses:  Rash   20 year old female with rash. She reports she woke up with rash on her forehead. On exam patient is afebrile, nontoxic. She has a very small patch of hives to her right forehead/temple. She also has some areas of acne noted. There are no signs of superimposed infection or cellulitis at this time. No oral lesions, airway clear. Recommended Benadryl. Given very mild rash, do not feel she needs systemic steroids at this time.  FU with PCP.  Discussed plan with patient, he/she acknowledged understanding and agreed with plan of care.  Return precautions given for new or worsening symptoms.  I personally performed the services described in this documentation, which was scribed in my presence. The recorded information has been reviewed and is accurate.  Garlon Hatchet, PA-C 05/24/15 1652  Azalia Bilis, MD 05/24/15 [phone]

## 2016-03-15 ENCOUNTER — Emergency Department (HOSPITAL_COMMUNITY): Payer: Self-pay

## 2016-03-15 ENCOUNTER — Emergency Department (HOSPITAL_COMMUNITY)
Admission: EM | Admit: 2016-03-15 | Discharge: 2016-03-15 | Disposition: A | Discharge status: Home / Self Care | Payer: Self-pay | Attending: Emergency Medicine | Admitting: Emergency Medicine

## 2016-03-15 ENCOUNTER — Encounter (HOSPITAL_COMMUNITY): Payer: Self-pay | Admitting: Certified Nurse Midwife

## 2016-03-15 DIAGNOSIS — R1030 Lower abdominal pain, unspecified: Secondary | ICD-10-CM

## 2016-03-15 DIAGNOSIS — K5900 Constipation, unspecified: Secondary | ICD-10-CM | POA: Insufficient documentation

## 2016-03-15 LAB — CBC
HCT: 38.2 % (ref 36.0–46.0)
HEMOGLOBIN: 12.4 g/dL (ref 12.0–15.0)
MCH: 22.2 pg — AB (ref 26.0–34.0)
MCHC: 32.5 g/dL (ref 30.0–36.0)
MCV: 68.3 fL — AB (ref 78.0–100.0)
Platelets: 214 10*3/uL (ref 150–400)
RBC: 5.59 MIL/uL — ABNORMAL HIGH (ref 3.87–5.11)
RDW: 14.7 % (ref 11.5–15.5)
WBC: 4.5 10*3/uL (ref 4.0–10.5)

## 2016-03-15 LAB — COMPREHENSIVE METABOLIC PANEL
ALBUMIN: 3.7 g/dL (ref 3.5–5.0)
ALK PHOS: 65 U/L (ref 38–126)
ALT: 17 U/L (ref 14–54)
ANION GAP: 7 (ref 5–15)
AST: 26 U/L (ref 15–41)
BUN: 11 mg/dL (ref 6–20)
CALCIUM: 9.1 mg/dL (ref 8.9–10.3)
CHLORIDE: 111 mmol/L (ref 101–111)
CO2: 23 mmol/L (ref 22–32)
Creatinine, Ser: 0.79 mg/dL (ref 0.44–1.00)
GFR calc Af Amer: >60 mL/min (ref >60)
GFR calc non Af Amer: >60 mL/min (ref >60)
GLUCOSE: 94 mg/dL (ref 65–99)
Potassium: 3.6 mmol/L (ref 3.5–5.1)
SODIUM: 141 mmol/L (ref 135–145)
Total Bilirubin: 0.6 mg/dL (ref 0.3–1.2)
Total Protein: 6.4 g/dL — ABNORMAL LOW (ref 6.5–8.1)

## 2016-03-15 LAB — WET PREP, GENITAL
CLUE CELLS WET PREP: NONE SEEN
SPERM: NONE SEEN
TRICH WET PREP: NONE SEEN
Yeast Wet Prep HPF POC: NONE SEEN

## 2016-03-15 LAB — LIPASE, BLOOD: LIPASE: 28 U/L (ref 11–51)

## 2016-03-15 LAB — URINALYSIS, ROUTINE W REFLEX MICROSCOPIC
Bilirubin Urine: NEGATIVE
GLUCOSE, UA: NEGATIVE mg/dL
Hgb urine dipstick: NEGATIVE
KETONES UR: NEGATIVE mg/dL
LEUKOCYTES UA: NEGATIVE
NITRITE: NEGATIVE
PH: 6 (ref 5.0–8.0)
Protein, ur: NEGATIVE mg/dL
SPECIFIC GRAVITY, URINE: 1.016 (ref 1.005–1.030)

## 2016-03-15 LAB — I-STAT BETA HCG BLOOD, ED (MC, WL, AP ONLY)

## 2016-03-15 MED ORDER — AZITHROMYCIN 250 MG PO TABS
1000.0000 mg | ORAL_TABLET | Freq: Once | ORAL | Status: AC
  Administered 2016-03-15: 1000 mg via ORAL
  Filled 2016-03-15: qty 4

## 2016-03-15 MED ORDER — CEFTRIAXONE SODIUM 1 G IJ SOLR: 500.0000 mg | Freq: Once | INTRAMUSCULAR | Status: DC

## 2016-03-15 MED ORDER — SODIUM CHLORIDE 0.9 % IV BOLUS (SEPSIS)
1000.0000 mL | Freq: Once | INTRAVENOUS | Status: AC
  Administered 2016-03-15: 1000 mL via INTRAVENOUS

## 2016-03-15 MED ORDER — ONDANSETRON HCL 4 MG/2ML IJ SOLN
4.0000 mg | Freq: Once | INTRAMUSCULAR | Status: AC
  Administered 2016-03-15: 4 mg via INTRAVENOUS
  Filled 2016-03-15: qty 2

## 2016-03-15 MED ORDER — MORPHINE SULFATE (PF) 4 MG/ML IV SOLN
4.0000 mg | Freq: Once | INTRAVENOUS | Status: AC
  Administered 2016-03-15: 4 mg via INTRAVENOUS
  Filled 2016-03-15: qty 1

## 2016-03-15 MED ORDER — IOPAMIDOL (ISOVUE-300) INJECTION 61%
INTRAVENOUS | Status: AC
  Administered 2016-03-15: 100 mL
  Filled 2016-03-15: qty 100

## 2016-03-15 MED ORDER — CEFTRIAXONE SODIUM 1 G IJ SOLR
500.0000 mg | INTRAMUSCULAR | Status: DC
  Administered 2016-03-15: 500 mg via INTRAMUSCULAR
  Filled 2016-03-15: qty 10

## 2016-03-15 NOTE — ED Triage Notes (Signed)
Pt states she woke up at 4AM with severe lower abdominal cramping. Pt denies N/V. Pt has not taken anything OTC for relief.

## 2016-03-15 NOTE — Discharge Instructions (Signed)
For constipation, stay hydrated, consider taking 4 caps of miralax in one 32oz bottle of gatorade (or similar.)  Taper to 3 caps daily, 2 caps daily until having regular bowel movements.  You will be called if your GC/Chl testing is positive, in which case I would recommend a longer course of antibiotics for abdominal pain--however at this time, feel your symptoms are likely secondary to constipation.

## 2016-03-15 NOTE — ED Provider Notes (Signed)
[facility] DEPT Provider Note   CSN: [id number] Arrival date & time: 03/15/16  [id number]     History   Chief Complaint Chief Complaint  Patient presents with  . Abdominal Pain    HPI Bethany Dodson is a 20 y.o. female.  HPI   Lower abdominal cramping pain, both sides of abdomen, woke up 4AM with pain. Never had anything like this before Severe pain Moving makes it worse Position curled in ball makes it better LMP spotting, reports comes and goes hasn't had normal menses, spotting days ago No vaginal discharge No dysuria   Past Medical History:  Diagnosis Date  . Eczema     Patient Active Problem List   Diagnosis Date Noted  . Atopic dermatitis 07/17/2014    History reviewed. No pertinent surgical history.  OB History    No data available       Home Medications    Prior to Admission medications   Medication Sig Start Date End Date Taking? Authorizing Provider  cephALEXin (KEFLEX) 500 MG capsule Take 1 capsule (500 mg total) by mouth 2 (two) times daily. Patient not taking: Reported on 04/04/2015 07/17/14   Rodolph BongEvan S Corey, MD  diphenhydrAMINE (BENADRYL) 25 MG tablet Take 1 tablet (25 mg total) by mouth every 6 (six) hours as needed for itching (Rash). 05/24/15   Garlon HatchetLisa M Sanders, PA-C  doxycycline (VIBRAMYCIN) 100 MG capsule Take 1 capsule (100 mg total) by mouth 2 (two) times daily. 04/04/15   Eyvonne MechanicJeffrey Hedges, PA-C  famotidine (PEPCID) 20 MG tablet Take 1 tablet (20 mg total) by mouth 2 (two) times daily. Patient not taking: Reported on 04/04/2015 04/09/14   Fayrene HelperBowie Tran, PA-C  guaiFENesin (ROBITUSSIN) 100 MG/5ML liquid Take 5-10 mLs (100-200 mg total) by mouth every 4 (four) hours as needed for cough. Patient not taking: Reported on 04/04/2015 05/24/14   Junius FinnerErin O'Malley, PA-C  ibuprofen (ADVIL,MOTRIN) 600 MG tablet Take 1 tablet (600 mg total) by mouth every 6 (six) hours as needed. Patient not taking: Reported on 04/04/2015 05/24/14   Junius FinnerErin O'Malley, PA-C  sodium chloride  (OCEAN) 0.65 % SOLN nasal spray Place 1 spray into both nostrils as needed for congestion. Patient not taking: Reported on 04/04/2015 05/24/14   Junius FinnerErin O'Malley, PA-C  traMADol (ULTRAM) 50 MG tablet Take 1 tablet (50 mg total) by mouth every 6 (six) hours as needed. 04/09/15   Danelle BerryLeisa Tapia, PA-C  triamcinolone ointment (KENALOG) 0.5 % Apply 1 application topically 2 (two) times daily. Patient not taking: Reported on 04/04/2015 07/17/14   Rodolph BongEvan S Corey, MD    Family History History reviewed. No pertinent family history.  Social History Social History  Substance Use Topics  . Smoking status: Never Smoker  . Smokeless tobacco: Never Used  . Alcohol use Yes     Comment: socially     Allergies   Patient has no known allergies.   Review of Systems Review of Systems  Constitutional: Negative for fever.  HENT: Negative for sore throat.   Eyes: Negative for visual disturbance.  Respiratory: Negative for cough and shortness of breath.   Cardiovascular: Negative for chest pain.  Gastrointestinal: Positive for abdominal pain. Negative for constipation, diarrhea, nausea and vomiting.  Genitourinary: Positive for pelvic pain. Negative for difficulty urinating, dysuria, vaginal bleeding and vaginal discharge.  Musculoskeletal: Negative for back pain and neck pain.  Skin: Negative for rash.  Neurological: Negative for syncope and headaches.     Physical Exam Updated Vital Signs BP 119/90   Pulse 81  Temp 98.2 F (36.8 C) (Oral)   Resp 16   Ht 5\' 5"  (1.651 m)   Wt 122 lb (55.3 kg)   LMP  (LMP Unknown)   SpO2 99%   BMI 20.30 kg/m   Physical Exam  Constitutional: She is oriented to person, place, and time. She appears well-developed and well-nourished. No distress.  HENT:  Head: Normocephalic and atraumatic.  Eyes: Conjunctivae and EOM are normal.  Neck: Normal range of motion.  Cardiovascular: Normal rate, regular rhythm, normal heart sounds and intact distal pulses.  Exam reveals no  gallop and no friction rub.   No murmur heard. Pulmonary/Chest: Effort normal and breath sounds normal. No respiratory distress. She has no wheezes. She has no rales.  Abdominal: Soft. She exhibits no distension. There is tenderness (diffuse, worse bilateral lower quadrants). There is guarding.  No CVA tenderness  Genitourinary: Uterus is tender. Cervix exhibits discharge (brown). Cervix exhibits no motion tenderness. Right adnexum displays tenderness. Left adnexum displays tenderness. There is tenderness in the vagina. Vaginal discharge found.  Musculoskeletal: She exhibits no edema or tenderness.  Neurological: She is alert and oriented to person, place, and time.  Skin: Skin is warm and dry. No rash noted. She is not diaphoretic. No erythema.  Nursing note and vitals reviewed.    ED Treatments / Results  Labs (all labs ordered are listed, but only abnormal results are displayed) Labs Reviewed  WET PREP, GENITAL - Abnormal; Notable for the following:       Result Value   WBC, Wet Prep HPF POC FEW (*)    All other components within normal limits  COMPREHENSIVE METABOLIC PANEL - Abnormal; Notable for the following:    Total Protein 6.4 (*)    All other components within normal limits  CBC - Abnormal; Notable for the following:    RBC 5.59 (*)    MCV 68.3 (*)    MCH 22.2 (*)    All other components within normal limits  URINALYSIS, ROUTINE W REFLEX MICROSCOPIC - Abnormal; Notable for the following:    Color, Urine STRAW (*)    All other components within normal limits  LIPASE, BLOOD  I-STAT BETA HCG BLOOD, ED ([facility], [facility], [facility] ONLY)  GC/CHLAMYDIA PROBE AMP ([place]) NOT AT [facility]    EKG  EKG Interpretation None       Radiology Ct Abdomen Pelvis W Contrast  Result Date: 03/15/2016 CLINICAL DATA:  Severe lower abdominal pain, evaluate for appendicitis EXAM: CT ABDOMEN AND PELVIS WITH CONTRAST TECHNIQUE: Multidetector CT imaging of the abdomen and pelvis was performed using  the standard protocol following bolus administration of intravenous contrast. CONTRAST:  100mL ISOVUE-300 IOPAMIDOL (ISOVUE-300) INJECTION 61% COMPARISON:  None. FINDINGS: Motion degraded images. Lower chest: Lung bases are clear. Hepatobiliary: Liver is within normal limits. Gallbladder is unremarkable. No intrahepatic or extrahepatic ductal dilatation. Pancreas: Within normal limits. Spleen: Within normal limits. Adrenals/Urinary Tract: Adrenal glands are within normal limits. Kidneys within normal limits.  No hydronephrosis. Bladder is underdistended but unremarkable. Stomach/Bowel: Stomach is within normal limits. No evidence of bowel obstruction. Normal appendix (coronal image 84). Vascular/Lymphatic: No evidence of abdominal aortic aneurysm. No suspicious abdominopelvic lymphadenopathy. Reproductive: Uterus is within normal limits. Bilateral ovaries are within normal limits (series 2/ image 58). Other: Trace pelvic ascites (series 2/image 57). No free air. Musculoskeletal: Visualized osseous structures are within normal limits. IMPRESSION: Motion degraded images. No evidence of bowel obstruction.  Normal appendix. No CT findings to account for the patient's lower abdominal  pain. Electronically Signed   By: Charline Bills M.D.   On: 03/15/2016 10:49    Procedures Procedures (including critical care time)  Medications Ordered in ED Medications  morphine 4 MG/ML injection 4 mg (4 mg Intravenous Given 03/15/16 0952)  ondansetron (ZOFRAN) injection 4 mg (4 mg Intravenous Given 03/15/16 0949)  sodium chloride 0.9 % bolus 1,000 mL (0 mLs Intravenous Stopped 03/15/16 1201)  iopamidol (ISOVUE-300) 61 % injection (100 mLs  Contrast Given 03/15/16 1000)  azithromycin (ZITHROMAX) tablet 1,000 mg (1,000 mg Oral Given 03/15/16 1213)     Initial Impression / Assessment and Plan / ED Course  I have reviewed the triage vital signs and the nursing notes.  Pertinent labs & imaging results that were  available during my care of the patient were reviewed by me and considered in my medical decision making (see chart for details).  Clinical Course    20 year old female presents with concern for bilateral lower cramping abdominal pain. Pregnancy test negative. Pain is not colicky, lower suspicion for ovarian torsion or nephrolithiasis. She has diffuse tenderness on exam with guarding and CT abdomen pelvis was done which showed no acute abnormalities. Doubt TOA given CT appearance as well as bilateral pain.  Pt has been treated for PID in past, however GC/chl negative at that time.  Discussed possibility of treating PID however feel we have other etiologies of pain. Patient states now she has not had BM since Thursday, and feel other possibilities of pain include constipation, menstrual cramps, ovarian cyst rupture.  Recommend miralax, PCP, OBGYN follow up.   Final Clinical Impressions(s) / ED Diagnoses   Final diagnoses:  Lower abdominal pain  Constipation, unspecified constipation type    New Prescriptions Discharge Medication List as of 03/15/2016 12:05 PM       Alvira Monday, MD 03/15/16 [id number]

## 2016-03-15 NOTE — ED Notes (Signed)
Pt to CT

## 2016-03-18 LAB — GC/CHLAMYDIA PROBE AMP (~~LOC~~) NOT AT ARMC
Chlamydia: NEGATIVE
NEISSERIA GONORRHEA: NEGATIVE

## 2016-04-06 ENCOUNTER — Emergency Department (HOSPITAL_COMMUNITY)
Admission: EM | Admit: 2016-04-06 | Discharge: 2016-04-06 | Disposition: A | Discharge status: Home / Self Care | Payer: Self-pay | Attending: Emergency Medicine | Admitting: Emergency Medicine

## 2016-04-06 ENCOUNTER — Emergency Department (HOSPITAL_COMMUNITY): Payer: Self-pay

## 2016-04-06 ENCOUNTER — Encounter (HOSPITAL_COMMUNITY): Payer: Self-pay | Admitting: Emergency Medicine

## 2016-04-06 DIAGNOSIS — S0083XA Contusion of other part of head, initial encounter: Secondary | ICD-10-CM

## 2016-04-06 DIAGNOSIS — S022XXA Fracture of nasal bones, initial encounter for closed fracture: Secondary | ICD-10-CM | POA: Insufficient documentation

## 2016-04-06 DIAGNOSIS — S0993XA Unspecified injury of face, initial encounter: Secondary | ICD-10-CM

## 2016-04-06 DIAGNOSIS — Y939 Activity, unspecified: Secondary | ICD-10-CM | POA: Insufficient documentation

## 2016-04-06 DIAGNOSIS — Z79899 Other long term (current) drug therapy: Secondary | ICD-10-CM | POA: Insufficient documentation

## 2016-04-06 DIAGNOSIS — Y999 Unspecified external cause status: Secondary | ICD-10-CM | POA: Insufficient documentation

## 2016-04-06 DIAGNOSIS — Y929 Unspecified place or not applicable: Secondary | ICD-10-CM | POA: Insufficient documentation

## 2016-04-06 DIAGNOSIS — H1131 Conjunctival hemorrhage, right eye: Secondary | ICD-10-CM | POA: Insufficient documentation
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
## 2016-04-06 NOTE — ED Notes (Signed)
Declined W/C at D/C and was escorted to lobby by RN. 
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
## 2016-04-11 ENCOUNTER — Emergency Department (HOSPITAL_COMMUNITY)
Admission: EM | Admit: 2016-04-11 | Discharge: 2016-04-11 | Disposition: A | Discharge status: Home / Self Care | Payer: Medicaid Other | Attending: Emergency Medicine | Admitting: Emergency Medicine

## 2016-04-11 ENCOUNTER — Encounter (HOSPITAL_COMMUNITY): Payer: Self-pay | Admitting: *Deleted

## 2016-04-11 DIAGNOSIS — J3489 Other specified disorders of nose and nasal sinuses: Secondary | ICD-10-CM | POA: Insufficient documentation

## 2016-04-11 DIAGNOSIS — Z09 Encounter for follow-up examination after completed treatment for conditions other than malignant neoplasm: Secondary | ICD-10-CM
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
## 2017-01-05 ENCOUNTER — Emergency Department (HOSPITAL_COMMUNITY)
Admission: EM | Admit: 2017-01-05 | Discharge: 2017-01-05 | Disposition: A | Discharge status: Home / Self Care | Payer: No Typology Code available for payment source | Attending: Emergency Medicine | Admitting: Emergency Medicine

## 2017-01-05 ENCOUNTER — Encounter: Payer: Self-pay | Admitting: Emergency Medicine

## 2017-01-05 DIAGNOSIS — R1030 Lower abdominal pain, unspecified: Secondary | ICD-10-CM | POA: Insufficient documentation

## 2017-01-05 DIAGNOSIS — R1084 Generalized abdominal pain: Secondary | ICD-10-CM

## 2017-01-05 DIAGNOSIS — N898 Other specified noninflammatory disorders of vagina: Secondary | ICD-10-CM | POA: Diagnosis present

## 2017-01-05 DIAGNOSIS — N946 Dysmenorrhea, unspecified: Secondary | ICD-10-CM | POA: Insufficient documentation

## 2017-01-05 LAB — POC URINE PREG, ED: Preg Test, Ur: NEGATIVE

## 2017-01-05 LAB — URINALYSIS, ROUTINE W REFLEX MICROSCOPIC
Bilirubin Urine: NEGATIVE
Glucose, UA: NEGATIVE mg/dL
Ketones, ur: NEGATIVE mg/dL
Leukocytes, UA: NEGATIVE
Nitrite: NEGATIVE
Protein, ur: NEGATIVE mg/dL
Specific Gravity, Urine: 1.03 (ref 1.005–1.030)
pH: 7 (ref 5.0–8.0)

## 2017-01-05 MED ORDER — IBUPROFEN 800 MG PO TABS: 800.0000 mg | ORAL_TABLET | Freq: Three times a day (TID) | ORAL | 0 refills | Status: DC

## 2017-01-05 MED ORDER — IBUPROFEN 800 MG PO TABS
800.0000 mg | ORAL_TABLET | Freq: Once | ORAL | Status: AC
  Administered 2017-01-05: 800 mg via ORAL
  Filled 2017-01-05: qty 1

## 2017-01-05 NOTE — Discharge Instructions (Signed)
800 mg of ibuprofen may be taken with food once every 8 hours. You may apply a warm heating pad over the abdomen to help with cramping. Sometimes caffeine can also help with cramping too.  Sometimes if you have a menstrual cycle that is different for 1 month it may be due to anovulation. If you continue to have worsening cramping and bleeding with her periods, please call and schedule an appointment with our OB/GYN practice. Their office is listed above.  If you develop new or worsening symptoms, including fever, chills, nausea, vomiting, or diarrhea, please return to the emergency department for reevaluation.

## 2017-01-05 NOTE — ED Triage Notes (Signed)
Pt states she stared her period 2 days ago and has been having lower abd pain. Pt reports prior to period was also having vaginal discharge.

## 2017-01-05 NOTE — ED Provider Notes (Signed)
[facility] EMERGENCY DEPARTMENT Provider Note   CSN: [id number] Arrival date & time: 01/05/17  1552     History   Chief Complaint Chief Complaint  Patient presents with  . Vaginal Discharge    HPI Bethany Dodson is a 21 y.o. female who presents to the emergency department with constant, worsening, crampy bilateral lower abdominal pain that began yesterday. She reports that her menstrual cycle began yesterday, but was supposed to start on 10/11. She denies back pain, dysuria, frequency, vaginal pain, itching, dyspareunia, N/V/D, fever, or chills. Last bowel movement was yesterday. She reports that she was at work earlier today, when the cramping became unbearable. She reports that she took 400 mg of ibuprofen at noon with some relief of her pain. No other treatment prior to arrival.  She is currently sexually active with one female partner. She does not use contraceptives.   The history is provided by the patient. No language interpreter was used.    Past Medical History:  Diagnosis Date  . Eczema     Patient Active Problem List   Diagnosis Date Noted  . Atopic dermatitis 07/17/2014    No past surgical history on file.  OB History    No data available       Home Medications    Prior to Admission medications   Medication Sig Start Date End Date Taking? Authorizing Provider  cephALEXin (KEFLEX) 500 MG capsule Take 1 capsule (500 mg total) by mouth 2 (two) times daily. Patient not taking: Reported on 04/04/2015 07/17/14   Rodolph Bong, MD  diphenhydrAMINE (BENADRYL) 25 MG tablet Take 1 tablet (25 mg total) by mouth every 6 (six) hours as needed for itching (Rash). 05/24/15   Garlon Hatchet, PA-C  doxycycline (VIBRAMYCIN) 100 MG capsule Take 1 capsule (100 mg total) by mouth 2 (two) times daily. 04/04/15   Hedges, Tinnie Gens, PA-C  famotidine (PEPCID) 20 MG tablet Take 1 tablet (20 mg total) by mouth 2 (two) times daily. Patient not taking: Reported on  04/04/2015 04/09/14   Fayrene Helper, PA-C  guaiFENesin (ROBITUSSIN) 100 MG/5ML liquid Take 5-10 mLs (100-200 mg total) by mouth every 4 (four) hours as needed for cough. Patient not taking: Reported on 04/04/2015 05/24/14   Lurene Shadow, PA-C  ibuprofen (ADVIL,MOTRIN) 800 MG tablet Take 1 tablet (800 mg total) by mouth 3 (three) times daily. 01/05/17   Donshay Lupinski A, PA-C  sodium chloride (OCEAN) 0.65 % SOLN nasal spray Place 1 spray into both nostrils as needed for congestion. Patient not taking: Reported on 04/04/2015 05/24/14   Lurene Shadow, PA-C  traMADol (ULTRAM) 50 MG tablet Take 1 tablet (50 mg total) by mouth every 6 (six) hours as needed. 04/09/15   Danelle Berry, PA-C  triamcinolone ointment (KENALOG) 0.5 % Apply 1 application topically 2 (two) times daily. Patient not taking: Reported on 04/04/2015 07/17/14   Rodolph Bong, MD    Family History No family history on file.  Social History Social History  Substance Use Topics  . Smoking status: Never Smoker  . Smokeless tobacco: Never Used  . Alcohol use Yes     Comment: socially     Allergies   Patient has no known allergies.   Review of Systems Review of Systems  Constitutional: Negative for activity change, chills and fever.  Respiratory: Negative for shortness of breath.   Cardiovascular: Negative for chest pain.  Gastrointestinal: Positive for abdominal pain. Negative for abdominal distention, constipation, diarrhea, nausea and  vomiting.  Genitourinary: Positive for menstrual problem and vaginal bleeding. Negative for dyspareunia, dysuria, flank pain, hematuria and urgency.  Musculoskeletal: Negative for back pain.  Skin: Negative for rash.     Physical Exam Updated Vital Signs BP 107/75 (BP Location: Left Arm)   Pulse 74   Temp 98.1 F (36.7 C) (Oral)   Resp 14   Ht  (1.651 m)   Wt 55.8 kg (123 lb)   LMP 01/05/2017   SpO2 100%   BMI 20.47 kg/m   Physical Exam  Constitutional: No distress.  HENT:    Head: Normocephalic.  Eyes: Conjunctivae are normal.  Neck: Neck supple.  Cardiovascular: Normal rate, regular rhythm, normal heart sounds and intact distal pulses.  Exam reveals no gallop and no friction rub.   No murmur heard. Pulmonary/Chest: Effort normal. No respiratory distress.  Abdominal: Soft. She exhibits no distension and no mass. There is tenderness. There is no rebound and no guarding. No hernia.  Mildly hyperactive bowel sounds in all 4 quadrants. Mild diffuse tenderness to palpation in all 4 quadrants. No focal tenderness. No CVA tenderness bilaterally.  Neurological: She is alert.  Skin: Skin is warm. No rash noted.  Psychiatric: Her behavior is normal.  Nursing note and vitals reviewed.  ED Treatments / Results  Labs (all labs ordered are listed, but only abnormal results are displayed) Labs Reviewed  URINALYSIS, ROUTINE W REFLEX MICROSCOPIC - Abnormal; Notable for the following:       Result Value   Hgb urine dipstick MODERATE (*)    Bacteria, UA RARE (*)    Squamous Epithelial / LPF 0-5 (*)    All other components within normal limits  POC URINE PREG, ED    EKG  EKG Interpretation None       Radiology No results found.  Procedures Procedures (including critical care time)  Medications Ordered in ED Medications  ibuprofen (ADVIL,MOTRIN) tablet 800 mg (not administered)     Initial Impression / Assessment and Plan / ED Course  I have reviewed the triage vital signs and the nursing notes.  Pertinent labs & imaging results that were available during my care of the patient were reviewed by me and considered in my medical decision making (see chart for details).     21 year old female presenting with crampy abdominal pain that began yesterday after she started her monthly menstrual cycle. No constitutional symptoms. No N/V/D. She reports that she has been able to eat [employer] since arriving in the ED without difficulty. She declines a pelvic exam  or concern for pelvic infection at this time. Negative pregnancy test. Urinalysis is unremarkable aside from Hgb. Participated and shared decision making conversation where the patient states that she would like a dose of ibuprofen and to be discharged. At this time, I do not feel that imaging is warranted. Doubt appendicitis, peritonitis, colitis, ovarian torsion, or other infection at this time. No acute distress. Vital signs stable. Will provide the patient with a referral to OB/GYN. Strict return precautions given. The patient is safe for discharge at this time.  Final Clinical Impressions(s) / ED Diagnoses   Final diagnoses:  Generalized abdominal cramping  Menstrual cramps    New Prescriptions New Prescriptions   IBUPROFEN (ADVIL,MOTRIN) 800 MG TABLET    Take 1 tablet (800 mg total) by mouth 3 (three) times daily.     Barkley Boards, PA-C 01/05/17 2041    Raeford Razor, MD 01/06/17 1150

## 2017-01-05 NOTE — ED Notes (Signed)
Provider in room. Pt declined pelvic exam and will be d/c.

## 2017-01-05 NOTE — ED Notes (Signed)
Pt verbalized understanding of d/c instructions and has no further questions. VSS, NAD. Removed all belongings.  

## 2017-06-04 ENCOUNTER — Encounter: Payer: Self-pay | Admitting: Obstetrics and Gynecology

## 2017-06-04 ENCOUNTER — Other Ambulatory Visit (HOSPITAL_COMMUNITY)
Admission: RE | Admit: 2017-06-04 | Discharge: 2017-06-04 | Disposition: A | Discharge status: Home / Self Care | Payer: PRIVATE HEALTH INSURANCE | Source: Ambulatory Visit | Attending: Obstetrics and Gynecology | Admitting: Obstetrics and Gynecology

## 2017-06-04 ENCOUNTER — Ambulatory Visit (INDEPENDENT_AMBULATORY_CARE_PROVIDER_SITE_OTHER): Payer: No Typology Code available for payment source | Admitting: Obstetrics and Gynecology

## 2017-06-04 ENCOUNTER — Other Ambulatory Visit: Payer: Self-pay

## 2017-06-04 VITALS — BP 104/78 | HR 80 | Resp 12 | Ht 65.5 in | Wt 124.0 lb

## 2017-06-04 DIAGNOSIS — A6004 Herpesviral vulvovaginitis: Secondary | ICD-10-CM

## 2017-06-04 DIAGNOSIS — Z0000 Encounter for general adult medical examination without abnormal findings: Secondary | ICD-10-CM

## 2017-06-04 DIAGNOSIS — Z01419 Encounter for gynecological examination (general) (routine) without abnormal findings: Secondary | ICD-10-CM | POA: Diagnosis not present

## 2017-06-04 DIAGNOSIS — Z113 Encounter for screening for infections with a predominantly sexual mode of transmission: Secondary | ICD-10-CM | POA: Diagnosis present

## 2017-06-04 DIAGNOSIS — Z8619 Personal history of other infectious and parasitic diseases: Secondary | ICD-10-CM | POA: Diagnosis not present

## 2017-06-04 DIAGNOSIS — N870 Mild cervical dysplasia: Secondary | ICD-10-CM | POA: Diagnosis not present

## 2017-06-04 DIAGNOSIS — Z124 Encounter for screening for malignant neoplasm of cervix: Secondary | ICD-10-CM | POA: Insufficient documentation

## 2017-06-04 DIAGNOSIS — N898 Other specified noninflammatory disorders of vagina: Secondary | ICD-10-CM

## 2017-06-04 MED ORDER — VALACYCLOVIR HCL 500 MG PO TABS: ORAL_TABLET | ORAL | 1 refills | Status: AC

## 2017-06-04 NOTE — Progress Notes (Signed)
22 y.o. G0P0000 SingleAfrican AmericanF here for annual exam.  She c/o a one week h/o an increase in clear vaginal d/c. No itching, burning, irritation. Slight odor.  Period Cycle (Days): 29 Period Duration (Days): 5 days  Period Pattern: Regular Menstrual Flow: Moderate Menstrual Control: Tampon Menstrual Control Change Freq (Hours): changes tampon twice daily  Dysmenorrhea: (!) Mild Dysmenorrhea Symptoms: Cramping  Sexually active, same partner since 7/18. Not using contraception. Only using W/D, not wanting pregnancy, but okay if she gets pregnant. No dyspareunia.  She has tried OCP's, nexplanon and depo-provera. She would forget the pills, bleed all the time on the nexplanon, too much effort to get the depo-provera. On a multivitamin with folic acid.  She has valtrex, takes it prn if she thinks she may be getting an outbreak. She hasn't had an outbreak for over a year.   Patient's last menstrual period was 06/03/2017.          Sexually active: Yes.    The current method of family planning is none.    Exercising: No.  The patient does not participate in regular exercise at present. Smoker:  no  Health Maintenance: Pap:  2017 +HPV  History of abnormal Pap:  Yes + HPV  TDaP:  unsure Gardasil: completed all 3    reports that  has never smoked. she has never used smokeless tobacco. She reports that she drinks about 0.6 oz of alcohol per week. She reports that she does not use drugs. She is going to [employer], getting an associates degree. Working at a call center.   Past Medical History:  Diagnosis Date  . Dysmenorrhea   . Eczema   . STD (sexually transmitted disease) 2016   Chlamydia     History reviewed. No pertinent surgical history.  Current Outpatient Medications  Medication Sig Dispense Refill  . diphenhydrAMINE (BENADRYL) 25 MG tablet Take 1 tablet (25 mg total) by mouth every 6 (six) hours as needed for itching (Rash). 30 tablet 0  . valACYclovir (VALTREX) 1000 MG tablet  Take 1,000 mg by mouth every other day.     No current facility-administered medications for this visit.     Family History  Problem Relation Age of Onset  . Hypertension Maternal Grandmother     Review of Systems  Constitutional: Negative.   HENT: Negative.   Eyes: Negative.   Respiratory: Negative.   Cardiovascular: Negative.   Gastrointestinal: Negative.   Endocrine: Negative.   Genitourinary: Positive for vaginal discharge.  Musculoskeletal: Negative.   Skin: Negative.   Allergic/Immunologic: Negative.   Neurological: Negative.   Psychiatric/Behavioral: Negative.     Exam:   BP 104/78 (BP Location: Right Arm, Patient Position: Sitting, Cuff Size: Normal)   Pulse 80   Resp 12   Ht 5' 5.5" (1.664 m)   Wt 124 lb (56.2 kg)   LMP 06/03/2017   BMI 20.32 kg/m   Weight change: @WEIGHTCHANGE @ Height:   Height: 5' 5.5" (166.4 cm)  Ht Readings from Last 3 Encounters:  06/04/17 5' 5.5" (1.664 m)  01/05/17 5\' 5"  (1.651 m)  04/11/16 5\' 3"  (1.6 m)    General appearance: alert, cooperative and appears stated age Head: Normocephalic, without obvious abnormality, atraumatic Neck: no adenopathy, supple, symmetrical, trachea midline and thyroid normal to inspection and palpation Lungs: clear to auscultation bilaterally Cardiovascular: regular rate and rhythm Breasts: normal appearance, no masses or tenderness Abdomen: soft, non-tender; non distended,  no masses,  no organomegaly Extremities: extremities normal, atraumatic, no cyanosis or edema  Skin: Skin color, texture, turgor normal. No rashes or lesions Lymph nodes: Cervical, supraclavicular, and axillary nodes normal. No abnormal inguinal nodes palpated Neurologic: Grossly normal   Pelvic: External genitalia:  no lesions              Urethra:  normal appearing urethra with no masses, tenderness or lesions              Bartholins and Skenes: normal                 Vagina: normal appearing vagina with normal color and  discharge, no lesions              Cervix: no lesions               Bimanual Exam:  Uterus:  normal size, contour, position, consistency, mobility, non-tender and anteverted              Adnexa: no mass, fullness, tenderness               Rectovaginal: Confirms               Anus:  normal sphincter tone, no lesions  Chaperone was present for exam.  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

## 2017-06-04 NOTE — Patient Instructions (Addendum)
Check to see if you are up to date on your TDAP vaccination.   EXERCISE AND DIET:  We recommended that you start or continue a regular exercise program for good health. Regular exercise means any activity that makes your heart beat faster and makes you sweat.  We recommend exercising at least 30 minutes per day at least 3 days a week, preferably 4 or 5.  We also recommend a diet low in fat and sugar.  Inactivity, poor dietary choices and obesity can cause diabetes, heart attack, stroke, and kidney damage, among others.    ALCOHOL AND SMOKING:  Women should limit their alcohol intake to no more than 7 drinks/beers/glasses of wine (combined, not each!) per week. Moderation of alcohol intake to this level decreases your risk of breast cancer and liver damage. And of course, no recreational drugs are part of a healthy lifestyle.  And absolutely no smoking or even second hand smoke. Most people know smoking can cause heart and lung diseases, but did you know it also contributes to weakening of your bones? Aging of your skin?  Yellowing of your teeth and nails?  CALCIUM AND VITAMIN D:  Adequate intake of calcium and Vitamin D are recommended.  The recommendations for exact amounts of these supplements seem to change often, but generally speaking 600 mg of calcium (either carbonate or citrate) and 800 units of Vitamin D per day seems prudent. Certain women may benefit from higher intake of Vitamin D.  If you are among these women, your doctor will have told you during your visit.    PAP SMEARS:  Pap smears, to check for cervical cancer or precancers,  have traditionally been done yearly, although recent scientific advances have shown that most women can have pap smears less often.  However, every woman still should have a physical exam from her gynecologist every year. It will include a breast check, inspection of the vulva and vagina to check for abnormal growths or skin changes, a visual exam of the cervix, and  then an exam to evaluate the size and shape of the uterus and ovaries.  And after 24 years of age, a rectal exam is indicated to check for rectal cancers. We will also provide age appropriate advice regarding health maintenance, like when you should have certain vaccines, screening for sexually transmitted diseases, bone density testing, colonoscopy, mammograms, etc.   MAMMOGRAMS:  All women over 24 years old should have a yearly mammogram. Many facilities now offer a "3D" mammogram, which may cost around $50 extra out of pocket. If possible,  we recommend you accept the option to have the 3D mammogram performed.  It both reduces the number of women who will be called back for extra views which then turn out to be normal, and it is better than the routine mammogram at detecting truly abnormal areas.    COLONOSCOPY:  Colonoscopy to screen for colon cancer is recommended for all women at age 24.  We know, you hate the idea of the prep.  We agree, BUT, having colon cancer and not knowing it is worse!!  Colon cancer so often starts as a polyp that can be seen and removed at colonscopy, which can quite literally save your life!  And if your first colonoscopy is normal and you have no family history of colon cancer, most women don't have to have it again for 10 years.  Once every ten years, you can do something that may end up saving your life, right?  We will be happy to help you get it scheduled when you are ready.  Be sure to check your insurance coverage so you understand how much it will cost.  It may be covered as a preventative service at no cost, but you should check your particular policy.      Breast Self-Awareness Breast self-awareness means being familiar with how your breasts look and feel. It involves checking your breasts regularly and reporting any changes to your health care provider. Practicing breast self-awareness is important. A change in your breasts can be a sign of a serious medical  problem. Being familiar with how your breasts look and feel allows you to find any problems early, when treatment is more likely to be successful. All women should practice breast self-awareness, including women who have had breast implants. How to do a breast self-exam One way to learn what is normal for your breasts and whether your breasts are changing is to do a breast self-exam. To do a breast self-exam: Look for Changes  1. Remove all the clothing above your waist. 2. Stand in front of a mirror in a room with good lighting. 3. Put your hands on your hips. 4. Push your hands firmly downward. 5. Compare your breasts in the mirror. Look for differences between them (asymmetry), such as: ? Differences in shape. ? Differences in size. ? Puckers, dips, and bumps in one breast and not the other. 6. Look at each breast for changes in your skin, such as: ? Redness. ? Scaly areas. 7. Look for changes in your nipples, such as: ? Discharge. ? Bleeding. ? Dimpling. ? Redness. ? A change in position. Feel for Changes  Carefully feel your breasts for lumps and changes. It is best to do this while lying on your back on the floor and again while sitting or standing in the shower or tub with soapy water on your skin. Feel each breast in the following way:  Place the arm on the side of the breast you are examining above your head.  Feel your breast with the other hand.  Start in the nipple area and make  inch (2 cm) overlapping circles to feel your breast. Use the pads of your three middle fingers to do this. Apply light pressure, then medium pressure, then firm pressure. The light pressure will allow you to feel the tissue closest to the skin. The medium pressure will allow you to feel the tissue that is a little deeper. The firm pressure will allow you to feel the tissue close to the ribs.  Continue the overlapping circles, moving downward over the breast until you feel your ribs below your  breast.  Move one finger-width toward the center of the body. Continue to use the  inch (2 cm) overlapping circles to feel your breast as you move slowly up toward your collarbone.  Continue the up and down exam using all three pressures until you reach your armpit.  Write Down What You Find  Write down what is normal for each breast and any changes that you find. Keep a written record with breast changes or normal findings for each breast. By writing this information down, you do not need to depend only on memory for size, tenderness, or location. Write down where you are in your menstrual cycle, if you are still menstruating. If you are having trouble noticing differences in your breasts, do not get discouraged. With time you will become more familiar with the variations in your  breasts and more comfortable with the exam. How often should I examine my breasts? Examine your breasts every month. If you are breastfeeding, the best time to examine your breasts is after a feeding or after using a breast pump. If you menstruate, the best time to examine your breasts is 5-7 days after your period is over. During your period, your breasts are lumpier, and it may be more difficult to notice changes. When should I see my health care provider? See your health care provider if you notice:  A change in shape or size of your breasts or nipples.  A change in the skin of your breast or nipples, such as a reddened or scaly area.  Unusual discharge from your nipples.  A lump or thick area that was not there before.  Pain in your breasts.  Anything that concerns you.  This information is not intended to replace advice given to you by your health care provider. Make sure you discuss any questions you have with your health care provider. Document Released: 03/10/2005 Document Revised: 08/16/2015 Document Reviewed: 01/28/2015 Elsevier Interactive Patient Education  [employer].

## 2017-06-05 LAB — COMPREHENSIVE METABOLIC PANEL
ALBUMIN: 4.4 g/dL (ref 3.5–5.5)
ALT: 13 IU/L (ref 0–32)
AST: 20 IU/L (ref 0–40)
Albumin/Globulin Ratio: 1.4 (ref 1.2–2.2)
Alkaline Phosphatase: 80 IU/L (ref 39–117)
BUN / CREAT RATIO: 11 (ref 9–23)
BUN: 10 mg/dL (ref 6–20)
Bilirubin Total: 0.5 mg/dL (ref 0.0–1.2)
CO2: 21 mmol/L (ref 20–29)
CREATININE: 0.89 mg/dL (ref 0.57–1.00)
Calcium: 9.5 mg/dL (ref 8.7–10.2)
Chloride: 106 mmol/L (ref 96–106)
GFR calc non Af Amer: 92 mL/min/{1.73_m2} (ref >59)
GFR, EST AFRICAN AMERICAN: 106 mL/min/{1.73_m2} (ref >59)
GLOBULIN, TOTAL: 3.1 g/dL (ref 1.5–4.5)
Glucose: 79 mg/dL (ref 65–99)
Potassium: 4.3 mmol/L (ref 3.5–5.2)
Sodium: 142 mmol/L (ref 134–144)
TOTAL PROTEIN: 7.5 g/dL (ref 6.0–8.5)

## 2017-06-05 LAB — HEP, RPR, HIV PANEL
HIV Screen 4th Generation wRfx: NONREACTIVE
Hepatitis B Surface Ag: NEGATIVE
RPR Ser Ql: NONREACTIVE

## 2017-06-05 LAB — VAGINITIS/VAGINOSIS, DNA PROBE
CANDIDA SPECIES: NEGATIVE
GARDNERELLA VAGINALIS: NEGATIVE
Trichomonas vaginosis: NEGATIVE

## 2017-06-05 LAB — LIPID PANEL
CHOL/HDL RATIO: 2.7 ratio (ref 0.0–4.4)
Cholesterol, Total: 157 mg/dL (ref 100–199)
HDL: 58 mg/dL (ref >39)
LDL CALC: 89 mg/dL (ref 0–99)
Triglycerides: 49 mg/dL (ref 0–149)
VLDL CHOLESTEROL CAL: 10 mg/dL (ref 5–40)

## 2017-06-05 LAB — CBC
HEMATOCRIT: 40.7 % (ref 34.0–46.6)
HEMOGLOBIN: 12.8 g/dL (ref 11.1–15.9)
MCH: 22.2 pg — AB (ref 26.6–33.0)
MCHC: 31.4 g/dL — ABNORMAL LOW (ref 31.5–35.7)
MCV: 71 fL — ABNORMAL LOW (ref 79–97)
Platelets: 307 10*3/uL (ref 150–379)
RBC: 5.76 x10E6/uL — AB (ref 3.77–5.28)
RDW: 17 % — ABNORMAL HIGH (ref 12.3–15.4)
WBC: 4.3 10*3/uL (ref 3.4–10.8)

## 2017-06-05 LAB — HEPATITIS C ANTIBODY: Hep C Virus Ab: <0.1 s/co ratio (ref 0.0–0.9)

## 2017-06-08 LAB — CYTOLOGY - PAP
Chlamydia: NEGATIVE
Neisseria Gonorrhea: NEGATIVE

## 2017-06-11 ENCOUNTER — Telehealth: Payer: Self-pay | Admitting: Obstetrics and Gynecology

## 2017-06-11 NOTE — Telephone Encounter (Signed)
Patient called returning Kaitlyn's phone call.

## 2017-06-11 NOTE — Telephone Encounter (Signed)
Spoke with patient. All results given. Patient verbalizes understanding. Patient will call with her next menstrual cycle to  schedule colposcopy as she has been sexually active since her LMP and is not on birth control. Will hold in results. Encounter closed.

## 2017-06-11 NOTE — Telephone Encounter (Signed)
Patient called requesting to speak with the nurse about her results she saw on MyChart.

## 2017-06-11 NOTE — Telephone Encounter (Signed)
Left message to call Favio Moder at [phone].  Notes recorded by Romualdo BolkJertson, Jill Evelyn, MD on 06/09/2017 at 6:15 PM EDT Given that this is her second abnormal pap, recommend colposcopy. Please inform and set up  Notes recorded by Romualdo BolkJertson, Jill Evelyn, MD on 06/05/2017 at 12:57 PM EDT 02 recall  Hi Bethany Dodson, Your blood work is normal. Your vaginitis panel was negative for infection. Your pap and cervical cultures are pending.  If you look at your CBC, some of the numbers are out of the normal range. That has to do with the size of your red blood cells. The important numbers are normal. Please call the office with any questions.  Bethany ExonJill Jertson

## 2017-07-03 ENCOUNTER — Telehealth: Payer: Self-pay

## 2017-07-03 DIAGNOSIS — R87612 Low grade squamous intraepithelial lesion on cytologic smear of cervix (LGSIL): Secondary | ICD-10-CM

## 2017-07-03 NOTE — Telephone Encounter (Signed)
Spoke with patient. Reports LMP 06/03/17, menses has not started to date. Advised patient to return call to office first day of menses to schedule colpo, can call office after hours/weekend, if needed, and leave message on machine, will return call to schedule.  Patient verbalizes understanding and is agreeable.

## 2017-07-03 NOTE — Telephone Encounter (Signed)
Left message to call Kaitlyn at [phone].  Calling to follow up on scheduling colposcopy. LMP was 06/03/2017 with 29 days cycle.

## 2017-07-03 NOTE — Telephone Encounter (Signed)
Patient returned call

## 2017-07-06 NOTE — Telephone Encounter (Signed)
Spoke with patient. Cycle started 4/13 and she is ready to schedule colposcopy. Appointment scheduled for 07/09/2017 at 1 pm with Dr.Jertson.  Instructions given. Motrin 800 mg po x , one hour before appointment with food. Make sure to eat a meal before appointment and drink plenty of fluids. Patient verbalized understanding and will call to reschedule if will be on menses or has any concerns regarding pregnancy. Patient agreeable and verbalized understanding of all instructions. Order placed.  Routing to provider for final review. Patient agreeable to disposition. Will close encounter.

## 2017-07-07 ENCOUNTER — Telehealth: Payer: Self-pay | Admitting: Obstetrics and Gynecology

## 2017-07-07 NOTE — Telephone Encounter (Signed)
Return call to Suzy. °

## 2017-07-07 NOTE — Telephone Encounter (Signed)
Call placed to patient to review benefits for a scheduled colposcopy. Left voicemail message requesting a return call.   cc: Bethany Dodson

## 2017-07-08 NOTE — Telephone Encounter (Signed)
Patient is returning a call to Suzy. °

## 2017-07-09 ENCOUNTER — Encounter: Payer: Self-pay | Admitting: Obstetrics and Gynecology

## 2017-07-09 ENCOUNTER — Ambulatory Visit (INDEPENDENT_AMBULATORY_CARE_PROVIDER_SITE_OTHER): Payer: No Typology Code available for payment source | Admitting: Obstetrics and Gynecology

## 2017-07-09 ENCOUNTER — Other Ambulatory Visit: Payer: Self-pay

## 2017-07-09 VITALS — BP 110/60 | HR 64 | Resp 14 | Ht 65.5 in | Wt 125.0 lb

## 2017-07-09 DIAGNOSIS — Z01812 Encounter for preprocedural laboratory examination: Secondary | ICD-10-CM

## 2017-07-09 DIAGNOSIS — R87612 Low grade squamous intraepithelial lesion on cytologic smear of cervix (LGSIL): Secondary | ICD-10-CM

## 2017-07-09 DIAGNOSIS — B977 Papillomavirus as the cause of diseases classified elsewhere: Secondary | ICD-10-CM | POA: Diagnosis not present

## 2017-07-09 LAB — POCT URINE PREGNANCY: PREG TEST UR: NEGATIVE

## 2017-07-09 NOTE — Telephone Encounter (Signed)
Returned call to patient to review benefits for scheduled appointment. Patient advises, she has upgraded her insurance, and thinks it takes effect today.  Patient is contact her insurance to verify and will call back. Upon call back patient will ask for [place] or Suzy

## 2017-07-09 NOTE — Telephone Encounter (Signed)
Spoke with patient patient advises her insurance coverage does not "upgrade" until the end of the 2019 year. Patient advises she will proceed with appointment (see account notes) scheduled for 07/09/17 with Dr Oscar LaJertson. No further questions. Ok to close

## 2017-07-09 NOTE — Patient Instructions (Signed)

## 2017-07-09 NOTE — Progress Notes (Signed)
GYNECOLOGY  VISIT   HPI: 22 y.o.   Single  African American  female   G0P0000 with Patient's last menstrual period was 07/04/2017.   here for evaluation of an abnormal pap. Pap with LSIL, +HPV, prior pap also abnormal. No c/o. No prior colposcopy  GYNECOLOGIC HISTORY: Patient's last menstrual period was 07/04/2017. Contraception:none  Menopausal hormone therapy: none         OB History    Gravida  0   Para  0   Term  0   Preterm  0   AB  0   Living  0     SAB  0   TAB  0   Ectopic  0   Multiple  0   Live Births  0              Patient Active Problem List   Diagnosis Date Noted  . Atopic dermatitis 07/17/2014    Past Medical History:  Diagnosis Date  . Dysmenorrhea   . Eczema   . STD (sexually transmitted disease) 2016   Chlamydia     History reviewed. No pertinent surgical history.  Current Outpatient Medications  Medication Sig Dispense Refill  . diphenhydrAMINE (BENADRYL) 25 MG tablet Take 1 tablet (25 mg total) by mouth every 6 (six) hours as needed for itching (Rash). 30 tablet 0  . valACYclovir (VALTREX) 500 MG tablet Take one tablet bid x 3 days prn 30 tablet 1   No current facility-administered medications for this visit.      ALLERGIES: Patient has no known allergies.  Family History  Problem Relation Age of Onset  . Hypertension Maternal Grandmother     Social History   Socioeconomic History  . Marital status: Single    Spouse name: Not on file  . Number of children: Not on file  . Years of education: Not on file  . Highest education level: Not on file  Occupational History  . Not on file  Social Needs  . Financial resource strain: Not on file  . Food insecurity:    Worry: Not on file    Inability: Not on file  . Transportation needs:    Medical: Not on file    Non-medical: Not on file  Tobacco Use  . Smoking status: Never Smoker  . Smokeless tobacco: Never Used  Substance and Sexual Activity  . Alcohol use: Yes   Alcohol/week: 0.6 oz    Types: 1 Standard drinks or equivalent per week  . Drug use: No  . Sexual activity: Yes    Partners: Male    Birth control/protection: None  Lifestyle  . Physical activity:    Days per week: Not on file    Minutes per session: Not on file  . Stress: Not on file  Relationships  . Social connections:    Talks on phone: Not on file    Gets together: Not on file    Attends religious service: Not on file    Active member of club or organization: Not on file    Attends meetings of clubs or organizations: Not on file    Relationship status: Not on file  . Intimate partner violence:    Fear of current or ex partner: Not on file    Emotionally abused: Not on file    Physically abused: Not on file    Forced sexual activity: Not on file  Other Topics Concern  . Not on file  Social History Narrative  . Not  on file    Review of Systems  Constitutional: Negative.   HENT: Negative.   Eyes: Negative.   Respiratory: Negative.   Cardiovascular: Negative.   Gastrointestinal: Negative.   Genitourinary: Negative.   Musculoskeletal: Negative.   Skin: Negative.   Neurological: Negative.   Endo/Heme/Allergies: Negative.   Psychiatric/Behavioral: Negative.     PHYSICAL EXAMINATION:    BP 110/60 (BP Location: Right Arm, Patient Position: Sitting, Cuff Size: Normal)   Pulse 64   Resp 14   Ht 5' 5.5" (1.664 m)   Wt 125 lb (56.7 kg)   LMP 07/04/2017   BMI 20.48 kg/m     General appearance: alert, cooperative and appears stated age  Pelvic: External genitalia:  no lesions              Urethra:  normal appearing urethra with no masses, tenderness or lesions              Bartholins and Skenes: normal                 Vagina: normal appearing vagina with normal color and discharge, no lesions              Cervix: no gross lesion  Colposcopy: not satisfactory. Aceto-white changes at 7 o'clock with mosaics, biopsy done. ECC done. Negative lugols of the upper vagina.    Chaperone was present for exam.  ASSESSMENT LSIL, +HPV, h/o prior abnormal pap. Reviewed abnormal pap results and recommendation for colposcopy    PLAN Colposcopy with biopsy and ECC Further plan depending on results   An After Visit Summary was printed and given to the patient.

## 2017-07-16 ENCOUNTER — Telehealth: Payer: Self-pay

## 2017-07-16 DIAGNOSIS — N871 Moderate cervical dysplasia: Secondary | ICD-10-CM

## 2017-07-16 NOTE — Telephone Encounter (Signed)
-----   Message from Romualdo BolkJill Evelyn Jertson, MD sent at 07/15/2017  5:58 PM EDT ----- Please inform the patient that her biopsy returned with CIN II, because of her biopsy and my inability to see the area where the cells are potentially affected (unsatisfactory colposcopy) she needs a leep. Please explain and schedule.

## 2017-07-16 NOTE — Telephone Encounter (Signed)
Spoke with patient. Advised of results as seen below. Patient verbalizes understanding. Patient is not on any form of birth control. Will call with the first day of her next menses to schedule LEEP. LEEP ordered. Will keep in results.  Routing to provider for final review. Patient agreeable to disposition. Will close encounter.

## 2017-07-17 ENCOUNTER — Telehealth: Payer: Self-pay | Admitting: Obstetrics and Gynecology

## 2017-07-17 NOTE — Telephone Encounter (Signed)
Patient says she is waiting on a return call about benefits for a procedure.

## 2017-07-23 NOTE — Telephone Encounter (Signed)
Call placed to convey benefits details for procedure.

## 2017-07-28 NOTE — Telephone Encounter (Signed)
Call placed to convey new benefit information.

## 2017-07-31 ENCOUNTER — Encounter (HOSPITAL_COMMUNITY): Payer: Self-pay

## 2017-07-31 ENCOUNTER — Emergency Department (HOSPITAL_COMMUNITY)
Admission: EM | Admit: 2017-07-31 | Discharge: 2017-07-31 | Disposition: A | Discharge status: Home / Self Care | Payer: No Typology Code available for payment source | Attending: Emergency Medicine | Admitting: Emergency Medicine

## 2017-07-31 ENCOUNTER — Emergency Department (HOSPITAL_COMMUNITY): Payer: No Typology Code available for payment source

## 2017-07-31 DIAGNOSIS — R079 Chest pain, unspecified: Secondary | ICD-10-CM | POA: Insufficient documentation

## 2017-07-31 DIAGNOSIS — R102 Pelvic and perineal pain: Secondary | ICD-10-CM | POA: Diagnosis not present

## 2017-07-31 DIAGNOSIS — R1030 Lower abdominal pain, unspecified: Secondary | ICD-10-CM | POA: Diagnosis not present

## 2017-07-31 DIAGNOSIS — R109 Unspecified abdominal pain: Secondary | ICD-10-CM

## 2017-07-31 LAB — CBC
HEMATOCRIT: 38 % (ref 36.0–46.0)
Hemoglobin: 12.3 g/dL (ref 12.0–15.0)
MCH: 22.4 pg — AB (ref 26.0–34.0)
MCHC: 32.4 g/dL (ref 30.0–36.0)
MCV: 69.2 fL — AB (ref 78.0–100.0)
PLATELETS: 221 10*3/uL (ref 150–400)
RBC: 5.49 MIL/uL — ABNORMAL HIGH (ref 3.87–5.11)
RDW: 14.8 % (ref 11.5–15.5)
WBC: 4.4 10*3/uL (ref 4.0–10.5)

## 2017-07-31 LAB — URINALYSIS, ROUTINE W REFLEX MICROSCOPIC
BILIRUBIN URINE: NEGATIVE
GLUCOSE, UA: NEGATIVE mg/dL
HGB URINE DIPSTICK: NEGATIVE
KETONES UR: NEGATIVE mg/dL
LEUKOCYTES UA: NEGATIVE
Nitrite: NEGATIVE
PH: 7 (ref 5.0–8.0)
PROTEIN: NEGATIVE mg/dL
Specific Gravity, Urine: 1.024 (ref 1.005–1.030)

## 2017-07-31 LAB — COMPREHENSIVE METABOLIC PANEL
ALK PHOS: 65 U/L (ref 38–126)
ALT: 17 U/L (ref 14–54)
AST: 29 U/L (ref 15–41)
Albumin: 3.8 g/dL (ref 3.5–5.0)
Anion gap: 7 (ref 5–15)
BILIRUBIN TOTAL: 0.7 mg/dL (ref 0.3–1.2)
BUN: 8 mg/dL (ref 6–20)
CO2: 26 mmol/L (ref 22–32)
CREATININE: 0.83 mg/dL (ref 0.44–1.00)
Calcium: 9.1 mg/dL (ref 8.9–10.3)
Chloride: 106 mmol/L (ref 101–111)
Glucose, Bld: 83 mg/dL (ref 65–99)
Potassium: 4 mmol/L (ref 3.5–5.1)
Sodium: 139 mmol/L (ref 135–145)
Total Protein: 7.1 g/dL (ref 6.5–8.1)

## 2017-07-31 LAB — I-STAT BETA HCG BLOOD, ED (MC, WL, AP ONLY): I-stat hCG, quantitative: <5 m[IU]/mL (ref <5)

## 2017-07-31 LAB — I-STAT TROPONIN, ED: Troponin i, poc: 0 ng/mL (ref 0.00–0.08)

## 2017-07-31 LAB — LIPASE, BLOOD: Lipase: 34 U/L (ref 11–51)

## 2017-07-31 MED ORDER — IBUPROFEN 400 MG PO TABS
400.0000 mg | ORAL_TABLET | Freq: Once | ORAL | Status: AC | PRN
  Administered 2017-07-31: 400 mg via ORAL
  Filled 2017-07-31: qty 1

## 2017-07-31 MED ORDER — IBUPROFEN 800 MG PO TABS: 800.0000 mg | ORAL_TABLET | Freq: Three times a day (TID) | ORAL | 0 refills | Status: AC

## 2017-07-31 NOTE — ED Notes (Signed)
Pt did not answer when calling for vital's update.

## 2017-07-31 NOTE — ED Provider Notes (Signed)
[facility] EMERGENCY DEPARTMENT Provider Note   CSN: [id number] Arrival date & time: 07/31/17  1059     History   Chief Complaint Chief Complaint  Patient presents with  . Abdominal Pain    HPI Bethany Dodson is a 22 y.o. female.  The history is provided by the patient.  Abdominal Pain   This is a new problem. The current episode started 6 to 12 hours ago. Episode frequency: every few hours. The pain is located in the suprapubic region. The pain is at a severity of 6/10. The pain is moderate. Pertinent negatives include anorexia, fever, diarrhea, nausea, vomiting, constipation, dysuria, hematuria, headaches and myalgias. Nothing aggravates the symptoms. Nothing relieves the symptoms. Her past medical history does not include gallstones or GERD.  Chest Pain   This is a new problem. The current episode started yesterday. Episode frequency: every few hours. The problem has been resolved. Associated with: laying flat. The pain is present in the substernal region. The pain is at a severity of 7/10. The pain is severe. The quality of the pain is described as brief and sharp. Radiates to: left chest. The symptoms are aggravated by certain positions (each time has started when lying flat). Associated symptoms include abdominal pain. Pertinent negatives include no cough, no dizziness, no fever, no headaches, no hemoptysis, no leg pain, no nausea, no palpitations, no shortness of breath, no vomiting and no weakness. Treatments tried: ibuprofen. The treatment provided significant relief.  Pertinent negatives for past medical history include no DVT and no PE.  Pertinent negatives for family medical history include: no aortic dissection, no heart disease, no early MI, no PE and no PVD.    Past Medical History:  Diagnosis Date  . Dysmenorrhea   . Eczema   . STD (sexually transmitted disease) 2016   Chlamydia     Patient Active Problem List   Diagnosis Date Noted  . Atopic  dermatitis 07/17/2014    History reviewed. No pertinent surgical history.   OB History    Gravida  0   Para  0   Term  0   Preterm  0   AB  0   Living  0     SAB  0   TAB  0   Ectopic  0   Multiple  0   Live Births  0            Home Medications    Prior to Admission medications   Medication Sig Start Date End Date Taking? Authorizing Provider  diphenhydrAMINE (BENADRYL) 25 MG tablet Take 1 tablet (25 mg total) by mouth every 6 (six) hours as needed for itching (Rash). 05/24/15   Garlon Hatchet, PA-C  ibuprofen (ADVIL,MOTRIN) 800 MG tablet Take 1 tablet (800 mg total) by mouth 3 (three) times daily for 7 days. 07/31/17 08/07/17  Levora Angel, MD  valACYclovir (VALTREX) 500 MG tablet Take one tablet bid x 3 days prn Patient not taking: Reported on 07/31/2017 06/04/17   Romualdo Bolk, MD    Family History Family History  Problem Relation Age of Onset  . Hypertension Maternal Grandmother     Social History Social History   Tobacco Use  . Smoking status: Never Smoker  . Smokeless tobacco: Never Used  Substance Use Topics  . Alcohol use: Yes    Alcohol/week: 0.6 oz    Types: 1 Standard drinks or equivalent per week  . Drug use: No     Allergies  Patient has no known allergies.   Review of Systems Review of Systems  Constitutional: Negative for fever.  Respiratory: Negative for cough, hemoptysis and shortness of breath.   Cardiovascular: Positive for chest pain. Negative for palpitations.  Gastrointestinal: Positive for abdominal pain. Negative for anorexia, constipation, diarrhea, nausea and vomiting.  Genitourinary: Negative for dysuria and hematuria.  Musculoskeletal: Negative for myalgias.  Neurological: Negative for dizziness, weakness and headaches.  All other systems reviewed and are negative.    Physical Exam Updated Vital Signs BP 108/80 (BP Location: Right Arm)   Pulse (!) 59   Temp 98.3 F (36.8 C) (Oral)   Resp 16    Ht  (1.651 m)   Wt 55.8 kg (123 lb)   LMP 07/04/2017   SpO2 99%   BMI 20.47 kg/m   Physical Exam  Constitutional: She appears well-developed and well-nourished. No distress.  HENT:  Head: Normocephalic and atraumatic.  Eyes: Conjunctivae are normal.  Neck: Neck supple.  Cardiovascular: Normal rate and regular rhythm.  No murmur heard. Pulmonary/Chest: Effort normal and breath sounds normal. No respiratory distress.  Abdominal: Soft. Normal appearance and bowel sounds are normal. There is tenderness (mild suprapubic) in the suprapubic area. There is no rigidity, no rebound, no guarding and no CVA tenderness.  Genitourinary:  Genitourinary Comments: Patient denied  Musculoskeletal: She exhibits no edema.  Neurological: She is alert.  Skin: Skin is warm and dry.  Psychiatric: She has a normal mood and affect.  Nursing note and vitals reviewed.    ED Treatments / Results  Labs (all labs ordered are listed, but only abnormal results are displayed) Labs Reviewed  CBC - Abnormal; Notable for the following components:      Result Value   RBC 5.49 (*)    MCV 69.2 (*)    MCH 22.4 (*)    All other components within normal limits  LIPASE, BLOOD  COMPREHENSIVE METABOLIC PANEL  URINALYSIS, ROUTINE W REFLEX MICROSCOPIC  I-STAT BETA HCG BLOOD, ED ([facility], [facility], [facility] ONLY)  I-STAT TROPONIN, ED    EKG EKG Interpretation  Date/Time:  Friday Jul 31 2017 11:32:20 EDT Ventricular Rate:  71 PR Interval:  158 QRS Duration: 70 QT Interval:  394 QTC Calculation: 428 R Axis:   79 Text Interpretation:  Normal sinus rhythm Normal ECG No prior ECG for comparison.  No STEMI Confirmed by Theda Belfast ([id number]) on 07/31/2017 5:07:31 PM   Radiology Dg Chest 2 View  Result Date: 07/31/2017 CLINICAL DATA:  Chest pain. EXAM: CHEST - 2 VIEW COMPARISON:  None. FINDINGS: The heart size and mediastinal contours are within normal limits. Both lungs are clear. No pneumothorax or pleural effusion is  noted. The visualized skeletal structures are unremarkable. IMPRESSION: No active cardiopulmonary disease. Electronically Signed   By: Lupita Raider, M.D.   On: 07/31/2017 12:46    Procedures Procedures (including critical care time)  Medications Ordered in ED Medications  ibuprofen (ADVIL,MOTRIN) tablet 400 mg (400 mg Oral Given 07/31/17 1619)     Initial Impression / Assessment and Plan / ED Course  I have reviewed the triage vital signs and the nursing notes.  Pertinent labs & imaging results that were available during my care of the patient were reviewed by me and considered in my medical decision making (see chart for details).     Debbra Digiulio is a 22 y.o. female presenting for episodic chest pain and abdominal pain. Both started yesterday but do not hit her simultaneously, contrary to triage  note. On my initial eval she is very well appearing with normal vitals. EKG shows normal sinus rhythm without ST or PR changes. Chest pain hx consistent with chest wall inflammation vs. Pericarditis.  Recommend anti-inflammatories 3 times daily for a week.  Suprapubic pain very mild on exam. Only feels discomfort to deep palpation. Pelvic exam explained to patient, she'd like to defer. Labs reassuring. Not pregnant, no UTI, no white count. Doubt appy at this time, though early is in ddx. Due for menstrual cycle, likely cramps. Strict return precautions provided.  Phone number to establish PCP provided.  Final Clinical Impressions(s) / ED Diagnoses   Final diagnoses:  Intermittent chest pain  Abdominal cramping    ED Discharge Orders        Ordered    ibuprofen (ADVIL,MOTRIN) 800 MG tablet  3 times daily     07/31/17 1900       Levora Angel, MD 07/31/17 1901    Tegeler, Canary Brim, MD 08/01/17 1016

## 2017-07-31 NOTE — ED Triage Notes (Signed)
Pt presents for evaluation of central chest pain and suprapubic pain that she states started simultaneously yesterday. Pt denies hx of heart problems. Denies urinary symptoms, abnormal vaginal discharge or bleeding. No N/V/D.

## 2017-08-05 ENCOUNTER — Telehealth: Payer: Self-pay | Admitting: Obstetrics and Gynecology

## 2017-08-05 NOTE — Telephone Encounter (Signed)
Return call to Jill. °

## 2017-08-05 NOTE — Telephone Encounter (Signed)
Left detailed message, ok per dpr. Advised LEEP procedure scheduled for 08/11/17 at 9am with Dr. Oscar La, arriving at 8:45am. Please return call to office to confirm and so that RN can review pre-procedure instructions.

## 2017-08-05 NOTE — Telephone Encounter (Signed)
Patient has started her cycle and is ready to schedule her LEEP procedure.

## 2017-08-05 NOTE — Telephone Encounter (Addendum)
Left message to call Bethany Dodson at [phone].   LEEP scheduled for 5/21 at 9am with Dr. Oscar La

## 2017-08-05 NOTE — Telephone Encounter (Signed)
Spoke with patient. LMP 08/05/17, heavier menses last for 5 days, request to schedule LEEP procedure.   Advised patient will review schedule with provider and return call. Patient is agreeable.

## 2017-08-07 NOTE — Telephone Encounter (Signed)
Spoke with pateint. Confirmed LEEP appointment as scheduled. Advised to take Motrin 800 mg with food and water one hour before procedure.   Routing to provider for final review. Patient is agreeable to disposition. Will close encounter.  Cc: Soundra Pilon, [address] [employer]

## 2017-08-07 NOTE — Telephone Encounter (Signed)
No return call from patient, returned call to confirm message received regarding upcoming procedure appt. Left message to call Noreene Larsson at [phone].

## 2017-08-11 ENCOUNTER — Encounter: Payer: Self-pay | Admitting: Obstetrics and Gynecology

## 2017-08-11 ENCOUNTER — Other Ambulatory Visit: Payer: Self-pay

## 2017-08-11 ENCOUNTER — Ambulatory Visit (INDEPENDENT_AMBULATORY_CARE_PROVIDER_SITE_OTHER): Payer: No Typology Code available for payment source | Admitting: Obstetrics and Gynecology

## 2017-08-11 VITALS — BP 102/60 | HR 60 | Resp 12 | Wt 124.0 lb

## 2017-08-11 DIAGNOSIS — L309 Dermatitis, unspecified: Secondary | ICD-10-CM

## 2017-08-11 DIAGNOSIS — N879 Dysplasia of cervix uteri, unspecified: Secondary | ICD-10-CM | POA: Diagnosis not present

## 2017-08-11 DIAGNOSIS — N871 Moderate cervical dysplasia: Secondary | ICD-10-CM

## 2017-08-11 DIAGNOSIS — Z01812 Encounter for preprocedural laboratory examination: Secondary | ICD-10-CM | POA: Diagnosis not present

## 2017-08-11 LAB — POCT URINE PREGNANCY: PREG TEST UR: NEGATIVE

## 2017-08-11 MED ORDER — TRIAMCINOLONE ACETONIDE 0.5 % EX OINT: 1.0000 "application " | TOPICAL_OINTMENT | Freq: Two times a day (BID) | CUTANEOUS | 0 refills | Status: AC

## 2017-08-11 NOTE — Patient Instructions (Signed)
LEEP Post-procedure Instructions . Cramping is common.  You may take Ibuprofen, Aleve, or Tylenol for the cramping.  This should resolve within the next two to three days.   . You may have bright red spotting or blackish discharge for several days after your procedure.  The discharge occurs because of a topical solution used to stop bleeding at the biopsy site(s).  The dark discharge will lighten and then turn clear before completely resolving.  You will need to wear a mini pad during this time. . . Refrain from putting anything in the vagina until the bleeding and/or discharge COMPLETLEY stops  . You need to call the office if you have any pelvic pain, fever, heavy bleeding, foul smelling vaginal discharge, or if you are concerned. . Shower or bathe as normal . You will be notified within one week of your biopsy results or we will discuss your results at your follow-up appointment if needed. . You will need to return for surveillance Pap smear(s) as advised by your physician. Marland Kitchen Avoid intercourse until after your follow up appointment

## 2017-08-11 NOTE — Progress Notes (Signed)
GYNECOLOGY  VISIT   HPI: 22 y.o.   Single  African American  female   G0P0000 with Patient's last menstrual period was 08/05/2017.   here for a LEEP. Recent colposcopy was unsatisfactory with  CIN II.   She is requesting a refill of her eczema ointment until she gets in with her primary. She has an area on her inner right leg that is bothering her.   GYNECOLOGIC HISTORY: Patient's last menstrual period was 08/05/2017. Contraception:none  Menopausal hormone therapy: none         OB History    Gravida  0   Para  0   Term  0   Preterm  0   AB  0   Living  0     SAB  0   TAB  0   Ectopic  0   Multiple  0   Live Births  0              Patient Active Problem List   Diagnosis Date Noted  . Atopic dermatitis 07/17/2014    Past Medical History:  Diagnosis Date  . Dysmenorrhea   . Eczema   . STD (sexually transmitted disease) 2016   Chlamydia     History reviewed. No pertinent surgical history.  Current Outpatient Medications  Medication Sig Dispense Refill  . diphenhydrAMINE (BENADRYL) 25 MG tablet Take 1 tablet (25 mg total) by mouth every 6 (six) hours as needed for itching (Rash). 30 tablet 0  . valACYclovir (VALTREX) 500 MG tablet Take one tablet bid x 3 days prn (Patient not taking: Reported on 07/31/2017) 30 tablet 1   No current facility-administered medications for this visit.      ALLERGIES: Patient has no known allergies.  Family History  Problem Relation Age of Onset  . Hypertension Maternal Grandmother     Social History   Socioeconomic History  . Marital status: Single    Spouse name: Not on file  . Number of children: Not on file  . Years of education: Not on file  . Highest education level: Not on file  Occupational History  . Not on file  Social Needs  . Financial resource strain: Not on file  . Food insecurity:    Worry: Not on file    Inability: Not on file  . Transportation needs:    Medical: Not on file    Non-medical:  Not on file  Tobacco Use  . Smoking status: Never Smoker  . Smokeless tobacco: Never Used  Substance and Sexual Activity  . Alcohol use: Yes    Alcohol/week: 0.6 oz    Types: 1 Standard drinks or equivalent per week  . Drug use: No  . Sexual activity: Yes    Partners: Male    Birth control/protection: None  Lifestyle  . Physical activity:    Days per week: Not on file    Minutes per session: Not on file  . Stress: Not on file  Relationships  . Social connections:    Talks on phone: Not on file    Gets together: Not on file    Attends religious service: Not on file    Active member of club or organization: Not on file    Attends meetings of clubs or organizations: Not on file    Relationship status: Not on file  . Intimate partner violence:    Fear of current or ex partner: Not on file    Emotionally abused: Not on file  Physically abused: Not on file    Forced sexual activity: Not on file  Other Topics Concern  . Not on file  Social History Narrative  . Not on file    Review of Systems  Constitutional: Negative.   HENT: Negative.   Eyes: Negative.   Respiratory: Negative.   Cardiovascular: Negative.   Gastrointestinal: Negative.   Genitourinary: Negative.   Musculoskeletal: Negative.   Skin: Negative.   Neurological: Negative.   Endo/Heme/Allergies: Negative.   Psychiatric/Behavioral: Negative.     PHYSICAL EXAMINATION:    BP 102/60 (BP Location: Right Arm, Patient Position: Sitting, Cuff Size: Normal)   Pulse 60   Resp 12   Wt 124 lb (56.2 kg)   LMP 08/05/2017   BMI 20.63 kg/m     General appearance: alert, cooperative and appears stated age  Pelvic: External genitalia:  no lesions              Urethra:  normal appearing urethra with no masses, tenderness or lesions              Bartholins and Skenes: normal                 Vagina: normal appearing vagina with normal color and discharge, no lesions              Cervix no gross  lesions  Procedure: The patient was counseled as to the risks of the procedure, including: infection, bleeding, future pregnancy risks and cervical stenosis. A consent form was signed.  Under colposcopic guidance, Lugols solution was placed on the cervix and a paracervical block was injected using 1% lidocaine with epinephrine. Under colposcopic guidance, the 2 x 0.8 cm loop was used to remove a portion of the ectocervix taking care to get the entire transformation zone.  A second 1 x 1 cm loop was used to remove a portion of the endocervix. The settings were 55 cut, 50 coag with a blend of 1.  An ECC was performed. The cautery ball was then used to cauterize the base of the biopsy site and monsels were placed. The patient tolerated the procedure well.   Chaperone was present for exam.  ASSESSMENT CIN II, unsatisfactory colposcopy Eczema    PLAN Loop cone cervical biopsy with ECC  F/U in one month Script for steroid ointment given   An After Visit Summary was printed and given to the patient.

## 2017-08-13 ENCOUNTER — Telehealth: Payer: Self-pay | Admitting: *Deleted

## 2017-08-13 NOTE — Telephone Encounter (Signed)
Spoke with patient, advised as seen below per Dr. Jertson. Patient verbalizes understanding and is agreeable. Will close encounter.  

## 2017-08-13 NOTE — Telephone Encounter (Signed)
-----   Message from Romualdo Bolk, MD sent at 08/13/2017 12:06 PM EDT ----- Please inform the patient that her pathology returned with CIN II, negative margins, negative ECC. She should be cured. F/U pap with hpv in 1 year at her annual (3/20) 08 recall

## 2017-08-13 NOTE — Telephone Encounter (Signed)
Notes recorded by Leda Min, RN on 08/13/2017 at 3:09 PM EDT Left message to call Noreene Larsson at [phone].  08 recall placed

## 2017-08-31 ENCOUNTER — Telehealth: Payer: Self-pay | Admitting: Obstetrics and Gynecology

## 2017-08-31 NOTE — Telephone Encounter (Signed)
Schedule her an office visit with me sometime in the next week.

## 2017-08-31 NOTE — Telephone Encounter (Signed)
Spoke with patient. Patient states that she had a LEEP on 08/11/17. On 08/22/2017 she started her menses. Is still on menses today. Bleeding was heavy at first, but has lessened. Changing pad 2 times a day. Mild cramping. Denies any fever, chills, or pain. Patient is concerned due to prolonged menses. Advised will review with Dr.Jertson and return call.

## 2017-08-31 NOTE — Telephone Encounter (Signed)
Patient had leep procedure on 08/11/17 and started cycle on 08/22/17 and she is still bleeding. Asked if that is normal.

## 2017-08-31 NOTE — Telephone Encounter (Signed)
Spoke with patient. Patient states that she will need to check her schedule and reutrn call to schedule.

## 2017-08-31 NOTE — Telephone Encounter (Signed)
Left message to call Shion Bluestein at [phone]. 

## 2017-09-01 NOTE — Telephone Encounter (Signed)
Spoke with patient. Appointment scheduled for 09/03/16 at 3:30 pm with Dr.Jertson. Patient is agreeable to date and time. Will close encounter.

## 2017-09-03 ENCOUNTER — Encounter: Payer: Self-pay | Admitting: Obstetrics and Gynecology

## 2017-09-03 ENCOUNTER — Ambulatory Visit (INDEPENDENT_AMBULATORY_CARE_PROVIDER_SITE_OTHER): Payer: No Typology Code available for payment source | Admitting: Obstetrics and Gynecology

## 2017-09-03 ENCOUNTER — Other Ambulatory Visit: Payer: Self-pay

## 2017-09-03 VITALS — BP 110/68 | HR 64 | Resp 14 | Wt 124.0 lb

## 2017-09-03 DIAGNOSIS — N6324 Unspecified lump in the left breast, lower inner quadrant: Secondary | ICD-10-CM | POA: Diagnosis not present

## 2017-09-03 DIAGNOSIS — N888 Other specified noninflammatory disorders of cervix uteri: Secondary | ICD-10-CM | POA: Diagnosis not present

## 2017-09-03 NOTE — Progress Notes (Signed)
GYNECOLOGY  VISIT   HPI: 22 y.o.   Single  African American  female   G0P0000 with Patient's last menstrual period was 08/22/2017.   here c/o bleeding since LEEP on 08-11-17. 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  Bleeding started June 1, initially just using a mini-pad, changed to a regular pad, changing it 2-3 x a day (not saturated). Due for a cycle now, thinks her cycle started last night, having typical cramps. Normal flow.  She also c/o a new left breast lump. She noticed it the other day, it's tender.  GYNECOLOGIC HISTORY: Patient's last menstrual period was 08/22/2017. Contraception:none Menopausal hormone therapy: none         OB History    Gravida  0   Para  0   Term  0   Preterm  0   AB  0   Living  0     SAB  0   TAB  0   Ectopic  0   Multiple  0   Live Births  0              Patient Active Problem List   Diagnosis Date Noted  . Atopic dermatitis 07/17/2014    Past Medical History:  Diagnosis Date  . Dysmenorrhea   . Eczema   . STD (sexually transmitted disease) 2016   Chlamydia     History reviewed. No pertinent surgical history.  Current Outpatient Medications  Medication Sig Dispense Refill  . diphenhydrAMINE (BENADRYL) 25 MG tablet Take 1 tablet (25 mg total) by mouth every 6 (six) hours as needed for itching (Rash). 30 tablet 0  . triamcinolone ointment (KENALOG) 0.5 % Apply 1 application topically 2 (two) times daily. 15 g 0  . valACYclovir (VALTREX) 500 MG tablet Take one tablet bid x 3 days prn 30 tablet 1   No current facility-administered medications for this visit.      ALLERGIES: Patient has no known allergies.  Family History  Problem Relation Age of Onset  . Hypertension Maternal Grandmother     Social History   Socioeconomic History  . Marital status: Single    Spouse name: Not on file  . Number of children: Not on file  . Years of education: Not on file  . Highest education level: Not on  file  Occupational History  . Not on file  Social Needs  . Financial resource strain: Not on file  . Food insecurity:    Worry: Not on file    Inability: Not on file  . Transportation needs:    Medical: Not on file    Non-medical: Not on file  Tobacco Use  . Smoking status: Never Smoker  . Smokeless tobacco: Never Used  Substance and Sexual Activity  . Alcohol use: Yes    Alcohol/week: 0.6 oz    Types: 1 Standard drinks or equivalent per week  . Drug use: No  . Sexual activity: Yes    Partners: Male    Birth control/protection: None  Lifestyle  . Physical activity:    Days per week: Not on file    Minutes per session: Not on file  . Stress: Not on file  Relationships  . Social connections:    Talks on phone: Not on file    Gets together: Not on file    Attends religious service: Not on file    Active member of club or organization: Not on file    Attends meetings of clubs  or organizations: Not on file    Relationship status: Not on file  . Intimate partner violence:    Fear of current or ex partner: Not on file    Emotionally abused: Not on file    Physically abused: Not on file    Forced sexual activity: Not on file  Other Topics Concern  . Not on file  Social History Narrative  . Not on file    Review of Systems  Constitutional: Negative.   HENT: Negative.   Eyes: Negative.   Respiratory: Negative.   Cardiovascular: Negative.   Gastrointestinal: Negative.   Genitourinary:       Bleeding since LEEP Cramping   Musculoskeletal: Negative.   Skin: Negative.   Neurological: Negative.   Endo/Heme/Allergies: Negative.   Psychiatric/Behavioral: Negative.     PHYSICAL EXAMINATION:    BP 110/68 (BP Location: Right Arm, Patient Position: Sitting, Cuff Size: Normal)   Pulse 64   Resp 14   Wt 124 lb (56.2 kg)   LMP 08/22/2017   BMI 20.63 kg/m     General appearance: alert, cooperative and appears stated age Breasts: in the left breast at 7-8 o'clock at the  periphery is a 1 x 4 mm smooth, mobile, tender lump. No skin changes, no other breast lumps.  No axillary adenopathy  Pelvic: External genitalia:  no lesions              Urethra:  normal appearing urethra with no masses, tenderness or lesions              Bartholins and Skenes: normal                 Vagina: normal appearing vagina with normal color and discharge, no lesions              Cervix: no cervical motion tenderness and healing well s/p leep, no active bleeding from the cervix, blood coming from within the uterus. The base of the leep site looks minimally friable, treated with silver nitrate.               Bimanual Exam:  Uterus:  normal size, contour, position, consistency, mobility, non-tender              Adnexa: no mass, fullness, tenderness                Chaperone was present for exam.  ASSESSMENT Cervical bleeding after leep, healing well, base of leep treated with silver nitrate.  New left breast lump, just started her cycle    PLAN F/U next week for leep f/u Will do breast exam again, if lump is still present will set her up for an ultrasound   An After Visit Summary was printed and given to the patient.

## 2017-09-10 ENCOUNTER — Other Ambulatory Visit: Payer: Self-pay

## 2017-09-10 ENCOUNTER — Ambulatory Visit (INDEPENDENT_AMBULATORY_CARE_PROVIDER_SITE_OTHER): Payer: No Typology Code available for payment source | Admitting: Obstetrics and Gynecology

## 2017-09-10 ENCOUNTER — Encounter: Payer: Self-pay | Admitting: Obstetrics and Gynecology

## 2017-09-10 ENCOUNTER — Ambulatory Visit: Payer: No Typology Code available for payment source | Admitting: Obstetrics and Gynecology

## 2017-09-10 VITALS — BP 100/60 | HR 60 | Resp 14 | Wt 123.0 lb

## 2017-09-10 DIAGNOSIS — Z9889 Other specified postprocedural states: Secondary | ICD-10-CM

## 2017-09-10 NOTE — Progress Notes (Signed)
GYNECOLOGY  VISIT   HPI: 22 y.o.   Single  African American  female   G0P0000 with Patient's last menstrual period was 08/22/2017.   here for follow up from LEEP. She is doing well, no vaginal bleeding. She hasn't been sexually active.  Pathology with CIN II with negative margins and negative ECC.  She is also due for a f/u breast check, lump at her 6/13 visit, cycle was just starting.  Advised to f/u after her cycle. She doesn't have time for this today.   GYNECOLOGIC HISTORY: Patient's last menstrual period was 08/22/2017. Contraception:none Menopausal hormone therapy: none         OB History    Gravida  0   Para  0   Term  0   Preterm  0   AB  0   Living  0     SAB  0   TAB  0   Ectopic  0   Multiple  0   Live Births  0              Patient Active Problem List   Diagnosis Date Noted  . Atopic dermatitis 07/17/2014    Past Medical History:  Diagnosis Date  . Dysmenorrhea   . Eczema   . STD (sexually transmitted disease) 2016   Chlamydia     History reviewed. No pertinent surgical history.  Current Outpatient Medications  Medication Sig Dispense Refill  . diphenhydrAMINE (BENADRYL) 25 MG tablet Take 1 tablet (25 mg total) by mouth every 6 (six) hours as needed for itching (Rash). 30 tablet 0  . triamcinolone ointment (KENALOG) 0.5 % Apply 1 application topically 2 (two) times daily. 15 g 0  . valACYclovir (VALTREX) 500 MG tablet Take one tablet bid x 3 days prn 30 tablet 1   No current facility-administered medications for this visit.      ALLERGIES: Patient has no known allergies.  Family History  Problem Relation Age of Onset  . Hypertension Maternal Grandmother     Social History   Socioeconomic History  . Marital status: Single    Spouse name: Not on file  . Number of children: Not on file  . Years of education: Not on file  . Highest education level: Not on file  Occupational History  . Not on file  Social Needs  . Financial  resource strain: Not on file  . Food insecurity:    Worry: Not on file    Inability: Not on file  . Transportation needs:    Medical: Not on file    Non-medical: Not on file  Tobacco Use  . Smoking status: Never Smoker  . Smokeless tobacco: Never Used  Substance and Sexual Activity  . Alcohol use: Yes    Alcohol/week: 0.6 oz    Types: 1 Standard drinks or equivalent per week  . Drug use: No  . Sexual activity: Yes    Partners: Male    Birth control/protection: None  Lifestyle  . Physical activity:    Days per week: Not on file    Minutes per session: Not on file  . Stress: Not on file  Relationships  . Social connections:    Talks on phone: Not on file    Gets together: Not on file    Attends religious service: Not on file    Active member of club or organization: Not on file    Attends meetings of clubs or organizations: Not on file    Relationship  status: Not on file  . Intimate partner violence:    Fear of current or ex partner: Not on file    Emotionally abused: Not on file    Physically abused: Not on file    Forced sexual activity: Not on file  Other Topics Concern  . Not on file  Social History Narrative  . Not on file    Review of Systems  Constitutional: Negative.   HENT: Negative.   Eyes: Negative.   Respiratory: Negative.   Cardiovascular: Negative.   Gastrointestinal: Negative.   Genitourinary: Negative.   Musculoskeletal: Negative.   Skin: Negative.   Neurological: Negative.   Endo/Heme/Allergies: Negative.   Psychiatric/Behavioral: Negative.     PHYSICAL EXAMINATION:    BP 100/60 (BP Location: Right Arm, Patient Position: Sitting, Cuff Size: Normal)   Pulse 60   Resp 14   Wt 123 lb (55.8 kg)   LMP 08/22/2017   BMI 20.47 kg/m     General appearance: alert, cooperative and appears stated age   Pelvic: External genitalia:  no lesions              Urethra:  normal appearing urethra with no masses, tenderness or lesions               Bartholins and Skenes: normal                 Vagina: normal appearing vagina with a slight increase in watery, brown vaginal d/c. Patient denies symptoms              Cervix: no lesions and healing well, appears slightly friable.              Bimanual Exam:  Uterus:  normal size, contour, position, consistency, mobility, non-tender              Adnexa: no mass, fullness, tenderness                           Anus:  normal sphincter tone, no lesions  Chaperone was present for exam.  ASSESSMENT F/U leep, cin II with negative margins. Healing well H/O small left breast lump (see 09/03/17 note), declines breast check today    PLAN Pap with hpv at her annual exam in 3/19 Return for breast check   An After Visit Summary was printed and given to the patient.

## 2017-09-15 ENCOUNTER — Ambulatory Visit: Payer: No Typology Code available for payment source | Admitting: Obstetrics and Gynecology

## 2017-12-21 ENCOUNTER — Telehealth: Payer: Self-pay | Admitting: Obstetrics and Gynecology

## 2017-12-21 NOTE — Telephone Encounter (Signed)
Patient would like to come in for std testing. Only available day is Tuesday.

## 2017-12-21 NOTE — Telephone Encounter (Signed)
Message left to return call to Triage Nurse at [phone].    

## 2017-12-22 NOTE — Telephone Encounter (Signed)
Patient returned call. She scheduled for Wednesday at 8:00.

## 2017-12-22 NOTE — Progress Notes (Deleted)
GYNECOLOGY  VISIT   HPI: 22 y.o.   Single Black or African American Not Hispanic or Latino  female   G0P0000 with No LMP recorded.   here for STD testing.  GYNECOLOGIC HISTORY: No LMP recorded. Contraception:*** Menopausal hormone therapy: None        OB History    Gravida  0   Para  0   Term  0   Preterm  0   AB  0   Living  0     SAB  0   TAB  0   Ectopic  0   Multiple  0   Live Births  0              Patient Active Problem List   Diagnosis Date Noted  . Atopic dermatitis 07/17/2014    Past Medical History:  Diagnosis Date  . Dysmenorrhea   . Eczema   . STD (sexually transmitted disease) 2016   Chlamydia     No past surgical history on file.  Current Outpatient Medications  Medication Sig Dispense Refill  . diphenhydrAMINE (BENADRYL) 25 MG tablet Take 1 tablet (25 mg total) by mouth every 6 (six) hours as needed for itching (Rash). 30 tablet 0  . ibuprofen (ADVIL,MOTRIN) 200 MG tablet Take 200 mg by mouth every 6 (six) hours as needed.    . triamcinolone ointment (KENALOG) 0.5 % Apply 1 application topically 2 (two) times daily. 15 g 0  . valACYclovir (VALTREX) 500 MG tablet Take one tablet bid x 3 days prn 30 tablet 1   No current facility-administered medications for this visit.      ALLERGIES: Patient has no known allergies.  Family History  Problem Relation Age of Onset  . Hypertension Maternal Grandmother     Social History   Socioeconomic History  . Marital status: Single    Spouse name: Not on file  . Number of children: Not on file  . Years of education: Not on file  . Highest education level: Not on file  Occupational History  . Not on file  Social Needs  . Financial resource strain: Not on file  . Food insecurity:    Worry: Not on file    Inability: Not on file  . Transportation needs:    Medical: Not on file    Non-medical: Not on file  Tobacco Use  . Smoking status: Never Smoker  . Smokeless tobacco: Never Used   Substance and Sexual Activity  . Alcohol use: Yes    Alcohol/week: 1.0 standard drinks    Types: 1 Standard drinks or equivalent per week  . Drug use: No  . Sexual activity: Yes    Partners: Male    Birth control/protection: None  Lifestyle  . Physical activity:    Days per week: Not on file    Minutes per session: Not on file  . Stress: Not on file  Relationships  . Social connections:    Talks on phone: Not on file    Gets together: Not on file    Attends religious service: Not on file    Active member of club or organization: Not on file    Attends meetings of clubs or organizations: Not on file    Relationship status: Not on file  . Intimate partner violence:    Fear of current or ex partner: Not on file    Emotionally abused: Not on file    Physically abused: Not on file  Forced sexual activity: Not on file  Other Topics Concern  . Not on file  Social History Narrative  . Not on file    Review of Systems  PHYSICAL EXAMINATION:    There were no vitals taken for this visit.    General appearance: alert, cooperative and appears stated age Neck: no adenopathy, supple, symmetrical, trachea midline and thyroid {CHL AMB PHY EX THYROID NORM DEFAULT:[phone]::"normal to inspection and palpation"} Breasts: {Exam; breast:13139::"normal appearance, no masses or tenderness"} Abdomen: soft, non-tender; non distended, no masses,  no organomegaly  Pelvic: External genitalia:  no lesions              Urethra:  normal appearing urethra with no masses, tenderness or lesions              Bartholins and Skenes: normal                 Vagina: normal appearing vagina with normal color and discharge, no lesions              Cervix: {CHL AMB PHY EX CERVIX NORM DEFAULT:[phone]::"no lesions"}              Bimanual Exam:  Uterus:  {CHL AMB PHY EX UTERUS NORM [phone]::"normal size, contour, position, consistency, mobility, non-tender"}              Adnexa: {CHL AMB PHY EX  ADNEXA NO MASS DEFAULT:[phone]::"no mass, fullness, tenderness"}              Rectovaginal: {yes no:[phone]}.  Confirms.              Anus:  normal sphincter tone, no lesions  Chaperone was present for exam.  ASSESSMENT     PLAN    An After Visit Summary was printed and given to the patient.  *** minutes face to face time of which over 50% was spent in counseling.

## 2017-12-22 NOTE — Telephone Encounter (Signed)
Patient has scheduled an appointment for STD testing. Will close encounter.

## 2017-12-23 ENCOUNTER — Ambulatory Visit: Payer: No Typology Code available for payment source | Admitting: Obstetrics and Gynecology

## 2017-12-23 ENCOUNTER — Telehealth: Payer: Self-pay | Admitting: Obstetrics and Gynecology

## 2017-12-23 NOTE — Telephone Encounter (Addendum)
Attempted to reach patient at number provided, there was no answer and the voicemail box is full.

## 2017-12-23 NOTE — Telephone Encounter (Signed)
Patient came into the office to see Dr. Oscar La for STD testing. At check-in she stepped out of the office to make a call and did not come back in to cancel or reschedule. Routing to Dr. Salli Quarry nurse for follow up.

## 2017-12-28 NOTE — Telephone Encounter (Signed)
Attempted to reach patient at number provided, there was no answer and voicemail box is full. 

## 2017-12-30 NOTE — Telephone Encounter (Signed)
Dr.Jertson, patient has not returned call x 2 okay to close encounter?

## 2018-06-11 IMAGING — CT CT ABD-PELV W/ CM
2 of 8 series · 14 of 46 positions shown, 18 images · IV contrast (Omni 300)
Comparison: None.

CLINICAL DATA: Severe lower abdominal pain, evaluate for
appendicitis

EXAM:
CT ABDOMEN AND PELVIS WITH CONTRAST
TECHNIQUE: Multidetector CT imaging of the abdomen and pelvis was performed
using the standard protocol following bolus administration of
intravenous contrast.
CONTRAST:  100mL WQB9ET-XSS IOPAMIDOL (WQB9ET-XSS) INJECTION 61%

[Series 2: a/p w/ 5mm · axial · 0.56mm/px · z∈[+862,+1212]mm · 11 of 80 slices shown, 15 images]
[im 5/80  soft-tissue]
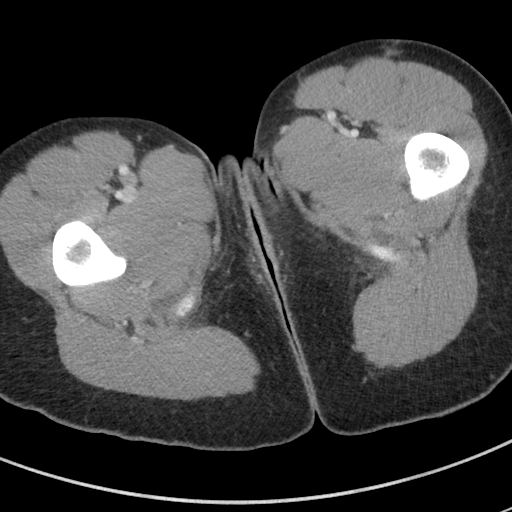
[im 5/80  bone]
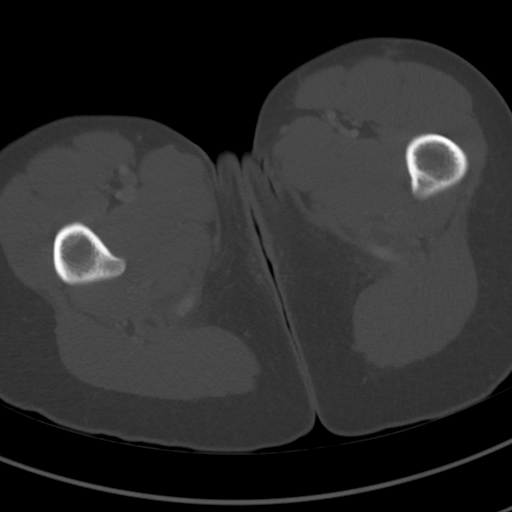
[im 14/80  soft-tissue]
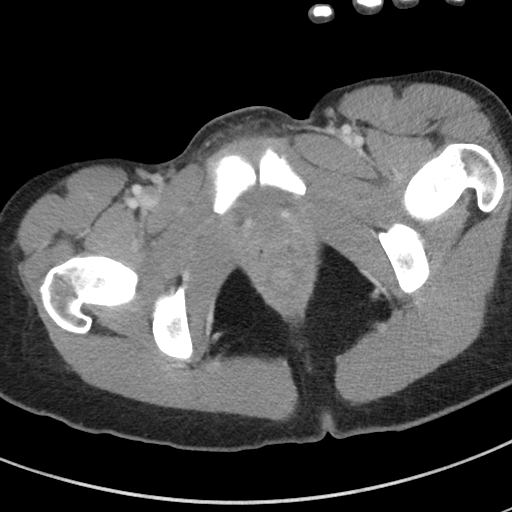
[im 22/80  soft-tissue]
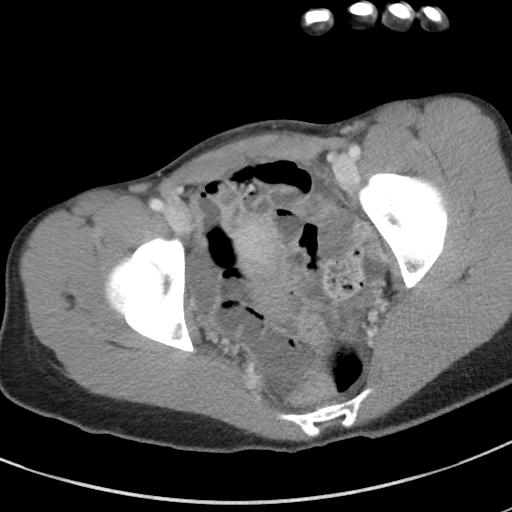
[im 31/80  soft-tissue]
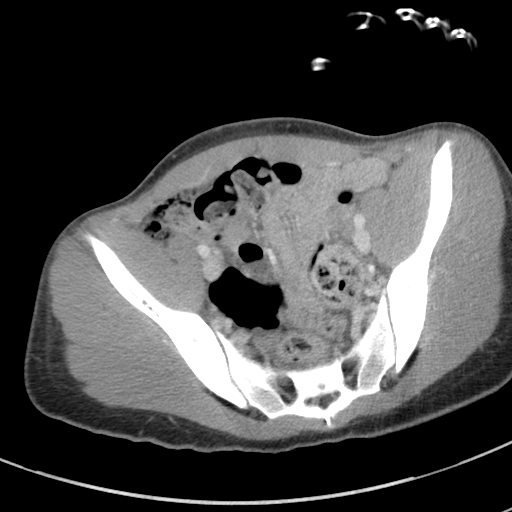
[im 40/80  soft-tissue]
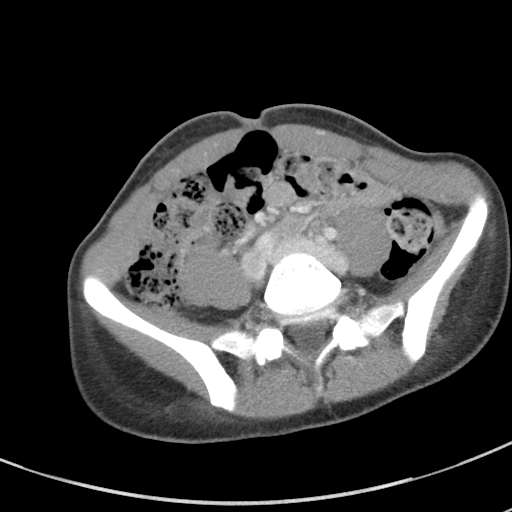
[im 49/80  soft-tissue]
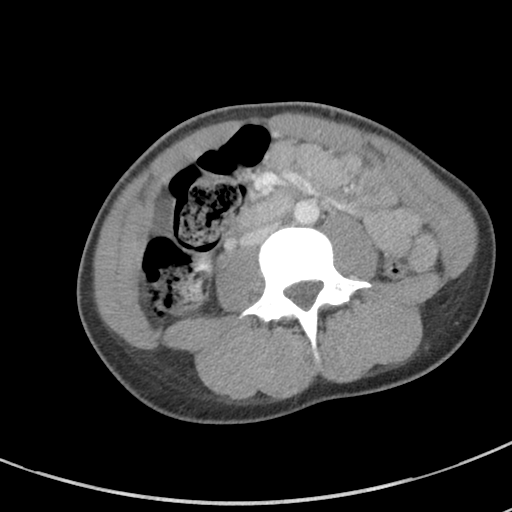
[im 58/80  soft-tissue]
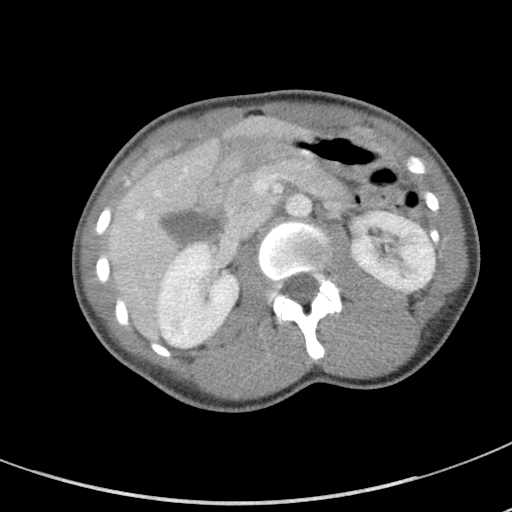
[im 62/80  lung]
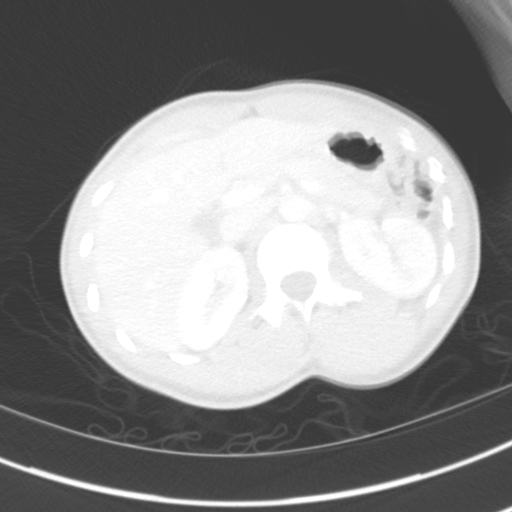
[im 66/80  soft-tissue]
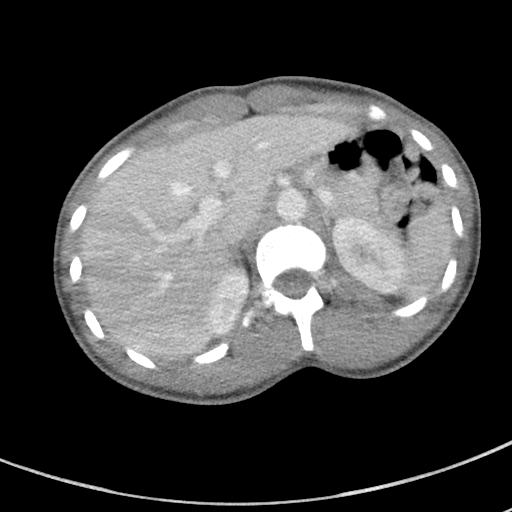
[im 66/80  lung]
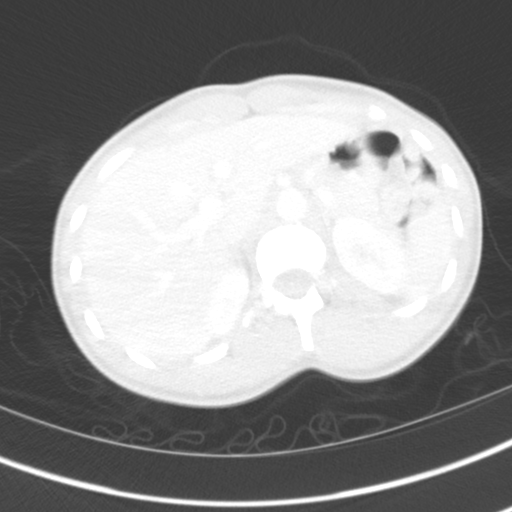
[im 71/80  lung]
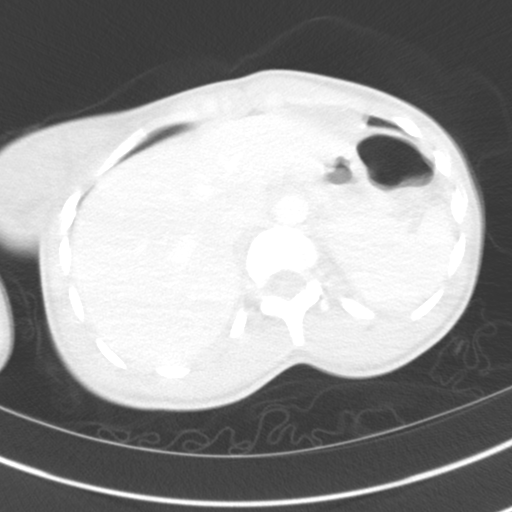
[im 75/80  soft-tissue]
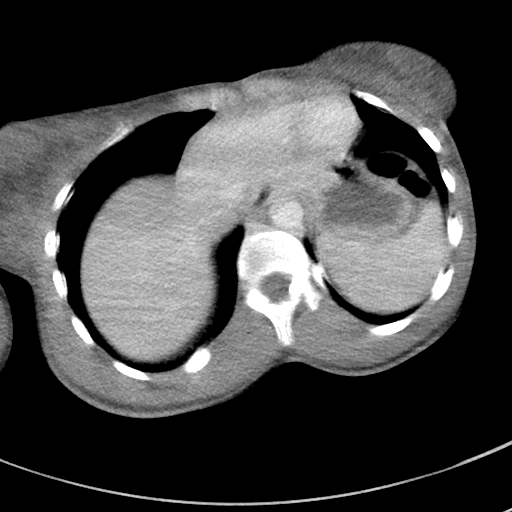
[im 75/80  lung]
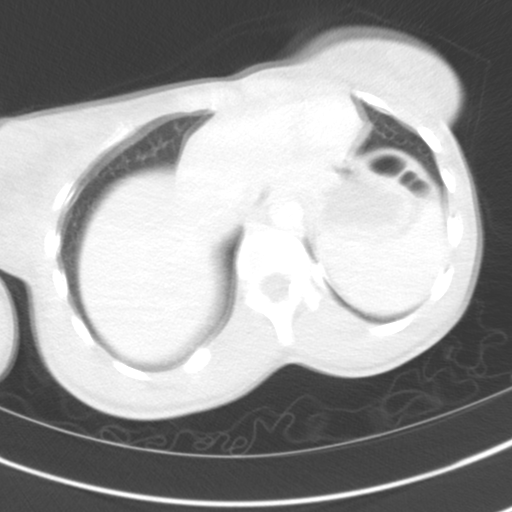
[im 75/80  bone]
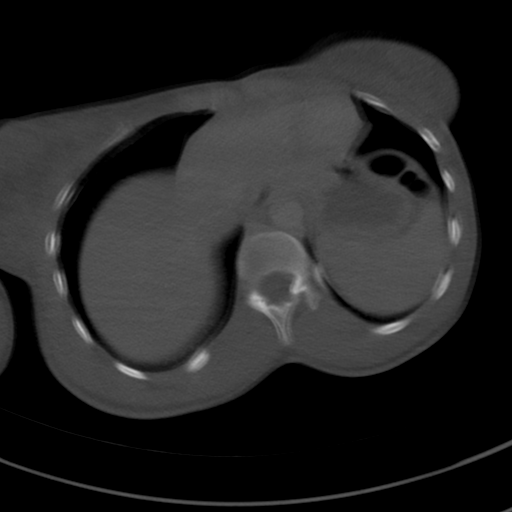

[Series 5: a/p w/ cor · coronal · 0.54mm/px · 3 of 151 slices shown]
[im 38/151  soft-tissue]
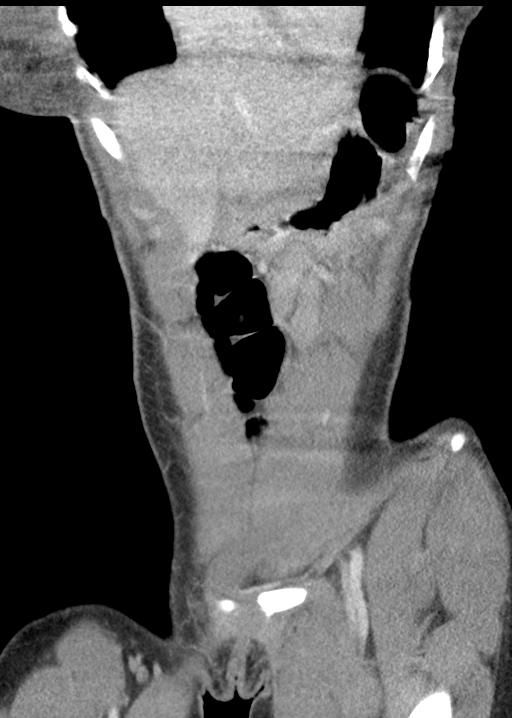
[im 76/151  soft-tissue]
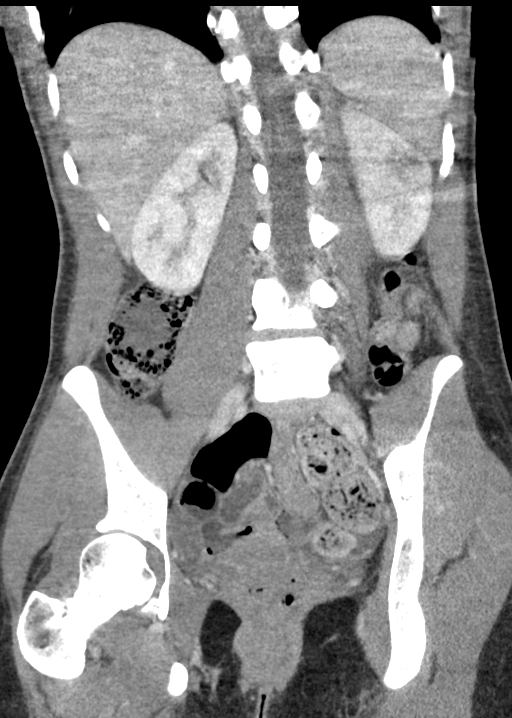
[im 113/151  soft-tissue]
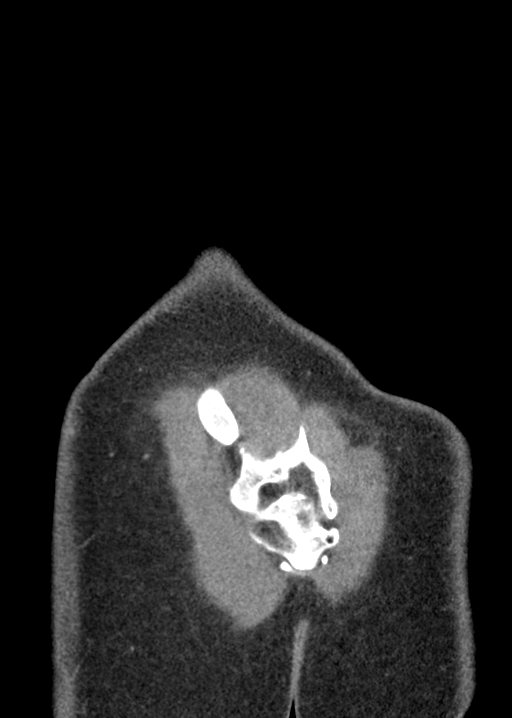

[14 of 46 positions shown; findings below may reference images not displayed]

FINDINGS: Motion degraded images.

Lower chest: Lung bases are clear.

Hepatobiliary: Liver is within normal limits.

Gallbladder is unremarkable. No intrahepatic or extrahepatic ductal
dilatation.

Pancreas: Within normal limits.

Spleen: Within normal limits.

Adrenals/Urinary Tract: Adrenal glands are within normal limits.

Kidneys within normal limits.  No hydronephrosis.

Bladder is underdistended but unremarkable.

Stomach/Bowel: Stomach is within normal limits.

No evidence of bowel obstruction.

Normal appendix (coronal image 84).

Vascular/Lymphatic: No evidence of abdominal aortic aneurysm.

No suspicious abdominopelvic lymphadenopathy.

Reproductive: Uterus is within normal limits.

Bilateral ovaries are within normal limits (series 2/ image 58).

Other: Trace pelvic ascites (series 2/image 57).

No free air.

Musculoskeletal: Visualized osseous structures are within normal
limits.
IMPRESSION: Motion degraded images.

No evidence of bowel obstruction.  Normal appendix.

No CT findings to account for the patient's lower abdominal pain.

## 2018-06-21 ENCOUNTER — Ambulatory Visit: Payer: No Typology Code available for payment source | Admitting: Obstetrics and Gynecology

## 2018-07-24 ENCOUNTER — Encounter (HOSPITAL_COMMUNITY): Payer: Self-pay | Admitting: Emergency Medicine

## 2018-07-24 ENCOUNTER — Emergency Department (HOSPITAL_COMMUNITY)
Admission: EM | Admit: 2018-07-24 | Discharge: 2018-07-24 | Disposition: A | Discharge status: Home / Self Care | Payer: No Typology Code available for payment source | Attending: Emergency Medicine | Admitting: Emergency Medicine

## 2018-07-24 ENCOUNTER — Other Ambulatory Visit: Payer: Self-pay

## 2018-07-24 DIAGNOSIS — J039 Acute tonsillitis, unspecified: Secondary | ICD-10-CM | POA: Insufficient documentation

## 2018-07-24 MED ORDER — AMOXICILLIN 500 MG PO CAPS
500.0000 mg | ORAL_CAPSULE | Freq: Once | ORAL | Status: AC
  Administered 2018-07-24: 22:00:00 500 mg via ORAL
  Filled 2018-07-24: qty 1

## 2018-07-24 MED ORDER — AMOXICILLIN 500 MG PO CAPS: 500.0000 mg | ORAL_CAPSULE | Freq: Three times a day (TID) | ORAL | 0 refills | Status: AC

## 2018-07-24 NOTE — ED Provider Notes (Signed)
[facility]   CSN: [id number] Arrival date & time: 07/24/18  2111    History   Chief Complaint Chief Complaint  Patient presents with  . Fever  . Sore Throat    HPI Bethany Dodson is a 23 y.o. female.     The history is provided by the patient. No language interpreter was used.  Fever  Temp source:  Subjective Severity:  Moderate Onset quality:  Gradual Duration:  2 days Timing:  Constant Progression:  Worsening Relieved by:  Nothing Associated symptoms: sore throat   Sore Throat   Pt complains of swollen tonsil and a sore throat.    Past Medical History:  Diagnosis Date  . Dysmenorrhea   . Eczema   . STD (sexually transmitted disease) 2016   Chlamydia     Patient Active Problem List   Diagnosis Date Noted  . Atopic dermatitis 07/17/2014    History reviewed. No pertinent surgical history.   OB History    Gravida  0   Para  0   Term  0   Preterm  0   AB  0   Living  0     SAB  0   TAB  0   Ectopic  0   Multiple  0   Live Births  0            Home Medications    Prior to Admission medications   Medication Sig Start Date End Date Taking? Authorizing Provider  amoxicillin (AMOXIL) 500 MG capsule Take 1 capsule (500 mg total) by mouth 3 (three) times daily. 07/24/18   Elson AreasSofia, Joory Gough K, PA-C  diphenhydrAMINE (BENADRYL) 25 MG tablet Take 1 tablet (25 mg total) by mouth every 6 (six) hours as needed for itching (Rash). 05/24/15   Garlon HatchetSanders, Lisa M, PA-C  ibuprofen (ADVIL,MOTRIN) 200 MG tablet Take 200 mg by mouth every 6 (six) hours as needed.    [provider]  triamcinolone ointment (KENALOG) 0.5 % Apply 1 application topically 2 (two) times daily. 08/11/17   Romualdo BolkJertson, Jill Evelyn, MD  valACYclovir (VALTREX) 500 MG tablet Take one tablet bid x 3 days prn 06/04/17   Romualdo BolkJertson, Jill Evelyn, MD    Family History Family History  Problem Relation Age of Onset  . Hypertension Maternal  Grandmother     Social History Social History   Tobacco Use  . Smoking status: Never Smoker  . Smokeless tobacco: Never Used  Substance Use Topics  . Alcohol use: Yes    Alcohol/week: 1.0 standard drinks    Types: 1 Standard drinks or equivalent per week  . Drug use: No     Allergies   Patient has no known allergies.   Review of Systems Review of Systems  Constitutional: Positive for fever.  HENT: Positive for sore throat.   All other systems reviewed and are negative.    Physical Exam Updated Vital Signs BP 116/73 (BP Location: Left Arm)   Pulse 74   Temp 98.3 F (36.8 C) (Oral)   Resp 18   Ht 5\' 5"  (1.651 m)   Wt 54.4 kg   SpO2 100%   BMI 19.97 kg/m   Physical Exam Vitals signs and nursing note reviewed.  Constitutional:      Appearance: She is well-developed.  HENT:     Head: Normocephalic.     Right Ear: Tympanic membrane normal.     Left Ear: Tympanic membrane normal.     Mouth/Throat:  Pharynx: Posterior oropharyngeal erythema present.     Tonsils: Tonsillar exudate present. 2+ on the right. 2+ on the left.  Neck:     Musculoskeletal: Normal range of motion.  Pulmonary:     Effort: Pulmonary effort is normal.  Abdominal:     General: There is no distension.  Musculoskeletal: Normal range of motion.  Skin:    General: Skin is warm.  Neurological:     Mental Status: She is alert and oriented to person, place, and time.      ED Treatments / Results  Labs (all labs ordered are listed, but only abnormal results are displayed) Labs Reviewed - No data to display  EKG None  Radiology No results found.  Procedures Procedures (including critical care time)  Medications Ordered in ED Medications  amoxicillin (AMOXIL) capsule 500 mg (has no administration in time range)     Initial Impression / Assessment and Plan / ED Course  I have reviewed the triage vital signs and the nursing notes.  Pertinent labs & imaging results that  were available during my care of the patient were reviewed by me and considered in my medical decision making (see chart for details).        MDM  Pt counseled on tonsillitis.  Pt given Rx for amoxicillin.  Out of work x 2 days.    Final Clinical Impressions(s) / ED Diagnoses   Final diagnoses:  Tonsillitis    ED Discharge Orders         Ordered    amoxicillin (AMOXIL) 500 MG capsule  3 times daily     07/24/18 2202        An After Visit Summary was printed and given to the patient.    Osie Cheeks 07/24/18 2211    Gerhard Munch, MD 07/25/18 [phone]

## 2018-07-24 NOTE — ED Triage Notes (Signed)
Reports sore throat and feeling like a fever for two days. Has not been around anybody with covid that she knows of.

## 2018-07-24 NOTE — Discharge Instructions (Addendum)
Return if any problems.

## 2018-08-09 ENCOUNTER — Encounter: Payer: Self-pay | Admitting: Obstetrics and Gynecology

## 2020-09-18 ENCOUNTER — Other Ambulatory Visit: Payer: Self-pay

## 2020-09-18 ENCOUNTER — Other Ambulatory Visit: Payer: Self-pay | Admitting: Physician Assistant

## 2020-09-18 ENCOUNTER — Ambulatory Visit (INDEPENDENT_AMBULATORY_CARE_PROVIDER_SITE_OTHER): Payer: Self-pay

## 2020-09-18 DIAGNOSIS — W19XXXA Unspecified fall, initial encounter: Secondary | ICD-10-CM

## 2020-09-18 DIAGNOSIS — S99922A Unspecified injury of left foot, initial encounter: Secondary | ICD-10-CM
